# Patient Record
Sex: Male | Born: 1975 | Race: White | Hispanic: No | Marital: Married | Smoking: Never smoker
Health system: Southern US, Community
[De-identification: ages and names within clinical notes are randomized; demographics above are authoritative.]

## PROBLEM LIST (undated history)

## (undated) DIAGNOSIS — F909 Attention-deficit hyperactivity disorder, unspecified type: Secondary | ICD-10-CM

## (undated) DIAGNOSIS — F988 Other specified behavioral and emotional disorders with onset usually occurring in childhood and adolescence: Secondary | ICD-10-CM

## (undated) DIAGNOSIS — H18509 Unspecified hereditary corneal dystrophies, unspecified eye: Secondary | ICD-10-CM

## (undated) DIAGNOSIS — M545 Low back pain, unspecified: Secondary | ICD-10-CM

## (undated) DIAGNOSIS — J45909 Unspecified asthma, uncomplicated: Secondary | ICD-10-CM

## (undated) DIAGNOSIS — G4730 Sleep apnea, unspecified: Secondary | ICD-10-CM

## (undated) DIAGNOSIS — J309 Allergic rhinitis, unspecified: Secondary | ICD-10-CM

## (undated) DIAGNOSIS — G4733 Obstructive sleep apnea (adult) (pediatric): Principal | ICD-10-CM

## (undated) DIAGNOSIS — G4731 Primary central sleep apnea: Secondary | ICD-10-CM

## (undated) HISTORY — DX: Other specified behavioral and emotional disorders with onset usually occurring in childhood and adolescence: F98.8

## (undated) HISTORY — PX: WISDOM TOOTH EXTRACTION: SHX21

## (undated) HISTORY — DX: Primary central sleep apnea: G47.31

## (undated) HISTORY — DX: Unspecified hereditary corneal dystrophies, unspecified eye: H18.509

## (undated) HISTORY — DX: Obstructive sleep apnea (adult) (pediatric): G47.33

## (undated) HISTORY — DX: Low back pain, unspecified: M54.50

## (undated) HISTORY — DX: Allergic rhinitis, unspecified: J30.9

## (undated) HISTORY — DX: Morbid (severe) obesity due to excess calories: E66.01

---

## 2004-05-26 ENCOUNTER — Encounter: Admission: RE | Admit: 2004-05-26 | Discharge: 2004-08-24 | Payer: Self-pay | Admitting: Emergency Medicine

## 2006-05-18 HISTORY — PX: TONGUE BASE REDUCTION SOMNOPLASTY: SHX2535

## 2011-07-01 ENCOUNTER — Emergency Department (HOSPITAL_COMMUNITY): Payer: 59

## 2011-07-01 ENCOUNTER — Emergency Department (HOSPITAL_COMMUNITY)
Admission: EM | Admit: 2011-07-01 | Discharge: 2011-07-01 | Disposition: A | Discharge status: Home / Self Care | Payer: 59 | Attending: Emergency Medicine | Admitting: Emergency Medicine

## 2011-07-01 ENCOUNTER — Encounter (HOSPITAL_COMMUNITY): Payer: Self-pay

## 2011-07-01 DIAGNOSIS — R112 Nausea with vomiting, unspecified: Secondary | ICD-10-CM | POA: Insufficient documentation

## 2011-07-01 DIAGNOSIS — R1013 Epigastric pain: Secondary | ICD-10-CM

## 2011-07-01 DIAGNOSIS — R6883 Chills (without fever): Secondary | ICD-10-CM | POA: Insufficient documentation

## 2011-07-01 DIAGNOSIS — R10819 Abdominal tenderness, unspecified site: Secondary | ICD-10-CM | POA: Insufficient documentation

## 2011-07-01 DIAGNOSIS — K7689 Other specified diseases of liver: Secondary | ICD-10-CM | POA: Insufficient documentation

## 2011-07-01 HISTORY — DX: Attention-deficit hyperactivity disorder, unspecified type: F90.9

## 2011-07-01 HISTORY — DX: Unspecified asthma, uncomplicated: J45.909

## 2011-07-01 HISTORY — DX: Sleep apnea, unspecified: G47.30

## 2011-07-01 LAB — CBC
Hemoglobin: 15.7 g/dL (ref 13.0–17.0)
MCH: 27.6 pg (ref 26.0–34.0)
MCHC: 34.4 g/dL (ref 30.0–36.0)
Platelets: 187 10*3/uL (ref 150–400)
RDW: 14.5 % (ref 11.5–15.5)

## 2011-07-01 LAB — DIFFERENTIAL
Basophils Absolute: 0 10*3/uL (ref 0.0–0.1)
Basophils Relative: 0 % (ref 0–1)
Monocytes Relative: 11 % (ref 3–12)
Neutro Abs: 4.6 10*3/uL (ref 1.7–7.7)
Neutrophils Relative %: 69 % (ref 43–77)

## 2011-07-01 LAB — COMPREHENSIVE METABOLIC PANEL
AST: 17 U/L (ref 0–37)
CO2: 26 mEq/L (ref 19–32)
Calcium: 9 mg/dL (ref 8.4–10.5)
Creatinine, Ser: 1.07 mg/dL (ref 0.50–1.35)
GFR calc Af Amer: >90 mL/min (ref >90)
GFR calc non Af Amer: 87 mL/min — ABNORMAL LOW (ref >90)

## 2011-07-01 LAB — URINALYSIS, ROUTINE W REFLEX MICROSCOPIC
Glucose, UA: NEGATIVE mg/dL
Leukocytes, UA: NEGATIVE
Protein, ur: NEGATIVE mg/dL

## 2011-07-01 MED ORDER — ONDANSETRON HCL 4 MG/2ML IJ SOLN
4.0000 mg | Freq: Once | INTRAMUSCULAR | Status: AC
Start: 2011-07-01 — End: 2011-07-01
  Administered 2011-07-01: 4 mg via INTRAVENOUS
  Filled 2011-07-01: qty 2

## 2011-07-01 MED ORDER — MORPHINE SULFATE 4 MG/ML IJ SOLN
4.0000 mg | Freq: Once | INTRAMUSCULAR | Status: AC
  Administered 2011-07-01: 4 mg via INTRAVENOUS
  Filled 2011-07-01: qty 1

## 2011-07-01 MED ORDER — ACETAMINOPHEN 325 MG PO TABS
650.0000 mg | ORAL_TABLET | Freq: Once | ORAL | Status: AC
Start: 2011-07-01 — End: 2011-07-01
  Administered 2011-07-01: 650 mg via ORAL
  Filled 2011-07-01: qty 2

## 2011-07-01 MED ORDER — SODIUM CHLORIDE 0.9 % IV BOLUS (SEPSIS)
1000.0000 mL | Freq: Once | INTRAVENOUS | Status: AC
  Administered 2011-07-01: 1000 mL via INTRAVENOUS

## 2011-07-01 MED ORDER — ONDANSETRON HCL 4 MG PO TABS: 4.0000 mg | ORAL_TABLET | Freq: Four times a day (QID) | ORAL | Status: AC | PRN

## 2011-07-01 NOTE — ED Provider Notes (Signed)
History     CSN: [id number]  Arrival date & time 07/01/11  1311   First MD Initiated Contact with Patient 07/01/11 1357      Chief Complaint  Patient presents with  . Abdominal Pain    (Consider location/radiation/quality/duration/timing/severity/associated sxs/prior treatment) Patient is a 36 y.o. male presenting with abdominal pain. The history is provided by the patient.  Abdominal Pain The primary symptoms of the illness include abdominal pain, nausea and vomiting. The primary symptoms of the illness do not include shortness of breath, diarrhea, hematemesis or dysuria. Fever: tmax 100.4 at PCP office today. Episode onset: 2am yesterday morning. The onset of the illness was sudden. The problem has not changed since onset. The abdominal pain is located in the epigastric region. The abdominal pain radiates to the RUQ and back. Pain scale: moderate severity. The abdominal pain is relieved by nothing. Exacerbated by: nothing.  The vomiting began yesterday. Vomiting occurs 2 to 5 times per day. The emesis contains stomach contents.  Additional symptoms associated with the illness include chills and heartburn. Symptoms associated with the illness do not include constipation, hematuria or back pain. Associated symptoms comments: Excessive belching.  Pt seen by PMD earlier today for same complaint and sent to ED for further evaluation. Hx of 2 prior similar episodes that resolved spontaneously after several days. No recent alcohol intake.  Past Medical History  Diagnosis Date  . Sleep apnea   . Gout     History reviewed. No pertinent past surgical history.  No family history on file.  History  Substance Use Topics  . Smoking status: Never Smoker   . Smokeless tobacco: Not on file  . Alcohol Use:       Review of Systems  Constitutional: Positive for chills. Fever: tmax 100.4 at PCP office today.  HENT: Negative for nosebleeds, sore throat, neck pain and neck stiffness.   Eyes:  Negative for pain and visual disturbance.  Respiratory: Negative for cough, chest tightness and shortness of breath.   Cardiovascular: Negative for chest pain and palpitations.  Gastrointestinal: Positive for heartburn, nausea, vomiting and abdominal pain. Negative for diarrhea, constipation and hematemesis.  Genitourinary: Negative for dysuria, hematuria and flank pain.  Musculoskeletal: Negative for back pain and gait problem.  Skin: Negative for rash.  Neurological: Negative for syncope, weakness, numbness and headaches.  Hematological: Does not bruise/bleed easily.  Psychiatric/Behavioral: Negative for confusion.    Allergies  Review of patient's allergies indicates no known allergies.  Home Medications   Current Outpatient Rx  Name Route Sig Dispense Refill  . IBUPROFEN 200 MG PO TABS Oral Take 400 mg by mouth every 6 (six) hours as needed. pain    . LISDEXAMFETAMINE DIMESYLATE 60 MG PO CAPS Oral Take 60 mg by mouth every morning.    Marland Kitchen LORATADINE 10 MG PO CAPS Oral Take 10 mg by mouth daily.    . MOMETASONE FUROATE 50 MCG/ACT NA SUSP Nasal Place 2 sprays into the nose daily.      BP 121/73  Pulse 110  Temp 99.2 F (37.3 C)  Resp 18  Wt 305 lb (138.347 kg)  SpO2 99%  Physical Exam  Nursing note and vitals reviewed. Constitutional: He is oriented to person, place, and time. He appears well-developed and well-nourished. No distress.  HENT:  Head: Normocephalic and atraumatic.  Right Ear: External ear normal.  Left Ear: External ear normal.  Mouth/Throat: Oropharynx is clear and moist.  Eyes: Conjunctivae are normal. Pupils are equal, round, and reactive  to light.  Neck: Normal range of motion. Neck supple.  Cardiovascular: Regular rhythm, normal heart sounds and intact distal pulses.   No murmur heard.      HR 104 on exam  Pulmonary/Chest: Effort normal and breath sounds normal. No respiratory distress. He has no wheezes. He exhibits no tenderness.  Abdominal: Soft.  Bowel sounds are normal. He exhibits no distension and no pulsatile midline mass. There is no hepatomegaly. There is tenderness in the right upper quadrant and epigastric area. There is no rigidity, no rebound and no guarding.       obese  Musculoskeletal: He exhibits no edema and no tenderness.  Neurological: He is alert and oriented to person, place, and time. No cranial nerve deficit. Coordination normal.  Skin: Skin is warm and dry. No rash noted.  Psychiatric: He has a normal mood and affect.    ED Course  Procedures (including critical care time)  Labs Reviewed  URINALYSIS, ROUTINE W REFLEX MICROSCOPIC - Abnormal; Notable for the following:    Ketones, ur TRACE (*)    All other components within normal limits  COMPREHENSIVE METABOLIC PANEL - Abnormal; Notable for the following:    Glucose, Bld 100 (*)    GFR calc non Af Amer 87 (*)    All other components within normal limits  CBC  LIPASE, BLOOD  DIFFERENTIAL  DIFFERENTIAL   Dg Chest 2 View  07/01/2011  *RADIOLOGY REPORT*  Clinical Data: Pain  CHEST - 2 VIEW  Comparison: None.  Findings: Heart size upper limits normal.  Lungs are clear.  No effusion.  Regional bones unremarkable.  IMPRESSION:  1.  No acute disease  Original Report Authenticated By: Osa Craver, M.D.   US Abdomen Complete  07/01/2011  *RADIOLOGY REPORT*  Clinical Data:  Right upper quadrant abdominal pain.  COMPLETE ABDOMINAL ULTRASOUND  Comparison:  None.  Findings:  Gallbladder:  No gallstones, gallbladder wall thickening, or pericholecystic fluid.  Common bile duct:  Normal in caliber measuring a maximum of 5.51mm.  Liver:  There is diffuse increased echogenicity of the liver and decreased through transmission consistent with fatty infiltration. No focal lesions or biliary dilatation.  IVC:  Not visualized due to body habitus.  Pancreas:  Limited visualization due to body habitus and overlying bowel gas.  Spleen:  Normal size and echogenicity without  focal lesions.  Right Kidney:  12.3 cm in length. Normal renal cortical thickness and echogenicity without focal lesions or hydronephrosis.  Left Kidney:  11.9 cm in length. Normal renal cortical thickness and echogenicity without focal lesions or hydronephrosis.  Abdominal aorta:  Normal caliber.  IMPRESSION:  1.  Normal sonographic appearance of the gallbladder and normal caliber common bile duct. 2.  Diffuse fatty infiltration of the liver. 3.  Poor visualization of the IVC, pancreas and aorta.  Original Report Authenticated By: P. Loralie Champagne, M.D.     1. Epigastric pain   2. Nausea and vomiting       MDM  2:00 PM Epigastric/RUQ pain x 2 days. Low-grade fever at PCP office. Labs, pain/nausea medication, fluid bolus ordered. Will re-eval.    4:05 PM Labs unremarkable (no leukocytosis, no elevated lipase, no transaminitis, no anemia). Continued pain- will order ABD [place] to eval for biliary pathology.    7:10 PM [place] reviewed, significant only for fatty liver. Pt with fever on most recent VS check, will order CXR to eval for occult pneumonia causing epigastric pain with fever.     8:13 PM  CXR without evidence of pneumonia. Pt in no distress. Abd benign on re-exam. Will d/c home. Pt has an rx for prilosec from his PCP that he has not started taking- advised that he begin taking this. WIll give rx for nausea medication. Declines pain medication rx, advised to take tylenol. Return precautions discussed.        Shaaron Adler, [place] 07/01/11 2017

## 2011-07-01 NOTE — Discharge Instructions (Signed)
Your labs showed no abnormalities to suggest a serious cause for your abdominal pain. Your ultrasound showed no inflammation of your gallbladder or gallstones. Take the prilosec as previously directed by your doctor. Use the zofran as needed for nausea/vomting. Take tylenol for pain as needed. Follow-up with your doctor as discussed. Return to the ER if needed for any new or concerning symptoms.  Abdominal Pain Abdominal pain can be caused by many things. Your caregiver decides the seriousness of your pain by an examination and possibly blood tests and X-rays. Many cases can be observed and treated at home. Most abdominal pain is not caused by a disease and will probably improve without treatment. However, in many cases, more time must pass before a clear cause of the pain can be found. Before that point, it may not be known if you need more testing, or if hospitalization or surgery is needed. HOME CARE INSTRUCTIONS   Do not take laxatives unless directed by your caregiver.   Take pain medicine only as directed by your caregiver.   Only take over-the-counter or prescription medicines for pain, discomfort, or fever as directed by your caregiver.   Try a clear liquid diet (broth, tea, or water) for as long as directed by your caregiver. Slowly move to a bland diet as tolerated.  SEEK IMMEDIATE MEDICAL CARE IF:   The pain does not go away.   You have a persistent fever.   You keep throwing up (vomiting).   The pain is felt only in portions of the abdomen. Pain in the right side could possibly be appendicitis. In an adult, pain in the left lower portion of the abdomen could be colitis or diverticulitis.   You pass bloody or black tarry stools.  MAKE SURE YOU:   Understand these instructions.   Will watch your condition.   Will get help right away if you are not doing well or get worse.  Document Released: 02/11/2005 Document Revised: 01/14/2011 Document Reviewed: 12/21/2007 [facility] [facility]  Patient Information 2012 [place], [place].

## 2011-07-01 NOTE — ED Notes (Signed)
Clare Gandy, RN at bedside to attempt IV start due to original IV not infusing well. Pt informed of pending abd [place]. Verbalizes understanding and agreement.

## 2011-07-01 NOTE — ED Notes (Signed)
Patient reports that he was sent from [facility] for further evaluation of his 3rd episode of epigastric pain with fever, vomiting and chills, no diarrhea. No radiation

## 2011-07-01 NOTE — ED Provider Notes (Signed)
Medical screening examination/treatment/procedure(s) were performed by non-physician practitioner and as supervising physician I was immediately available for consultation/collaboration.  Ethelda Chick, MD 07/01/11 2037

## 2011-07-01 NOTE — ED Notes (Signed)
Family at bedside. 

## 2012-08-25 ENCOUNTER — Institutional Professional Consult (permissible substitution): Payer: Self-pay | Admitting: Neurology

## 2012-08-25 ENCOUNTER — Ambulatory Visit (INDEPENDENT_AMBULATORY_CARE_PROVIDER_SITE_OTHER): Payer: BC Managed Care – PPO | Admitting: Neurology

## 2012-08-25 ENCOUNTER — Encounter: Payer: Self-pay | Admitting: Neurology

## 2012-08-25 VITALS — BP 133/87 | HR 98 | Temp 98.7°F | Ht 70.0 in | Wt 304.0 lb

## 2012-08-25 DIAGNOSIS — G4733 Obstructive sleep apnea (adult) (pediatric): Secondary | ICD-10-CM | POA: Insufficient documentation

## 2012-08-25 DIAGNOSIS — J3489 Other specified disorders of nose and nasal sinuses: Secondary | ICD-10-CM

## 2012-08-25 DIAGNOSIS — F988 Other specified behavioral and emotional disorders with onset usually occurring in childhood and adolescence: Secondary | ICD-10-CM

## 2012-08-25 DIAGNOSIS — J309 Allergic rhinitis, unspecified: Secondary | ICD-10-CM

## 2012-08-25 DIAGNOSIS — G4730 Sleep apnea, unspecified: Secondary | ICD-10-CM

## 2012-08-25 DIAGNOSIS — R0981 Nasal congestion: Secondary | ICD-10-CM

## 2012-08-25 DIAGNOSIS — G4731 Primary central sleep apnea: Secondary | ICD-10-CM | POA: Insufficient documentation

## 2012-08-25 HISTORY — DX: Obstructive sleep apnea (adult) (pediatric): G47.33

## 2012-08-25 HISTORY — DX: Other specified behavioral and emotional disorders with onset usually occurring in childhood and adolescence: F98.8

## 2012-08-25 HISTORY — DX: Allergic rhinitis, unspecified: J30.9

## 2012-08-25 HISTORY — DX: Primary central sleep apnea: G47.31

## 2012-08-25 NOTE — Patient Instructions (Addendum)
We are going to do a sleep study with full night bipap and I will prescribe a new machine for you.

## 2012-08-25 NOTE — Progress Notes (Signed)
Subjective:    Patient ID: Christian Shaw is a 37 y.o. male.  HPI Christian Foley, MD, PhD [facility] [address], [address] [address] [place], [place] [address]  Dear Dr. Nicholos Johns,   I saw your patient, Christian Shaw, upon your kind request in my neurologic clinic today for initial consultation of his sleep disorder. The patient is accompanied By his wife, Christian Shaw, today. As you know, Mr. Chavira is a very pleasant 37 year old right-handed gentleman with an underlying medical history of gout, allergic rhinitis, ADD, and reflux who was diagnosed with complex sleep apnea by a sleep specialist at [facility] and was given a BiPAP machine. He denies any sleep paralysis, cataplexy, hypnagogic or hypnopompic hallucinations or sleep attacks. He Has had parasomnias. He denies any recurrent headaches. I saw note from [facility] from 07/24/2008 at which time he was presenting for followup of his severe obstructive sleep apnea with complex features. His BiPAP was high at 19/11 cm with a backup rate of 11. His backup rate was reduced at 8. He does have a history of sleep apnea surgery in February 2009 In the form of rhinoplasty. His first sleep study was in 08/2006 and I reviewed his sleep studies, that he brought for me For review. He also brought his most recent compliance data for review which are appreciated greatly. His baseline sleep study from April 2008 showed severe obstructive events. He had 252 obstructive apneas. His AHI was 108.55 per hour. He had a total of 27 central apneas and 6 central hypotony is. His lowest oxygen saturation was 73%. He was diagnosed with severe complex sleep apnea. He was then brought back a month later for CPAP titration and was titrated to a pressure of 15 cm with oxygen at 2 L per minute. He had trouble tolerating CPAP and eventually had a BiPAP titration. Originally he was placed on BiPAP of 19/11 with a backup rate of 11 and then changed to a backup rate of 8. I  reviewed his compliance data from 01/28/2012 to 07/25/2012 which is a total of 180 days, during which time he used BiPAP every night with an average usage of 6 hours and 3 minutes, and percent used days greater than 4 hours of 94.4%. I congratulated him on his excellent compliance.His main complaints with using BiPAP at this juncture or the high pressure, intermittent severe nasal congestion, and he is wondering if he could have a more updated machine. This machine is from 2008 from what I understand. He should be eligible for a new machine. His  humidifier setting cannot be changed. Also he has been trying to lose weight. He has lost about 30 pounds in the last 12 months. He has been overweight for quite some time. He has been on a stimulant for his ADD for over 12 months and that has also helped with weight loss. He uses a Regulatory affairs officer [employer].   His Past Medical History Is Significant For: Past Medical History  Diagnosis Date  . Sleep apnea   . Gout   . Childhood asthma   . ADHD (attention deficit hyperactivity disorder)   . CSA (central sleep apnea) 08/25/2012  . OSA (obstructive sleep apnea) 08/25/2012  . ADD (attention deficit disorder) 08/25/2012  . Allergic rhinitis 08/25/2012    His Past Surgical History Is Significant For: Past Surgical History  Procedure Laterality Date  . Wisdom tooth extraction      His Family History Is Significant For: No family history on  file.  His Social History Is Significant For: History   Social History  . Marital Status: Unknown    Spouse Name: N/A    Number of Children: N/A  . Years of Education: N/A   Social History Main Topics  . Smoking status: Never Smoker   . Smokeless tobacco: None  . Alcohol Use: Yes  . Drug Use: No  . Sexually Active:    Other Topics Concern  . None   Social History Narrative  . None    His Allergies Are:  No Known Allergies:   His Current Medications Are:  Outpatient Encounter  Prescriptions as of 08/25/2012  Medication Sig Dispense Refill  . ibuprofen (ADVIL,MOTRIN) 200 MG tablet Take 400 mg by mouth every 6 (six) hours as needed. pain      . lisdexamfetamine (VYVANSE) 60 MG capsule Take 60 mg by mouth every morning.      . Loratadine (CLARITIN) 10 MG CAPS Take 10 mg by mouth daily.      . mometasone (NASONEX) 50 MCG/ACT nasal spray Place 2 sprays into the nose daily.       No facility-administered encounter medications on file as of 08/25/2012.  :   Review of Systems  Allergic/Immunologic: Positive for environmental allergies.  Psychiatric/Behavioral: Positive for sleep disturbance.       Insomnia, sleepiness    Objective:  Neurologic Exam  Physical Exam Physical Examination:   Filed Vitals:   08/25/12 0853  BP: 133/87  Pulse: 98  Temp: 98.7 F (37.1 C)    General Examination: The patient is a very pleasant 37 y.o. male in no acute distress.  HEENT: Normocephalic, atraumatic, pupils are equal, round and reactive to light and accommodation. Funduscopic exam is normal with sharp disc margins noted. Extraocular tracking is good without nystagmus noted. Normal smooth pursuit is noted. Hearing is grossly intact. Tympanic membranes are clear bilaterally. Face is symmetric with normal facial animation and normal facial sensation. Speech is clear with no dysarthria noted. There is no hypophonia. There is no lip, neck or jaw tremor. Neck is supple with full range of motion. There are no carotid bruits on auscultation. Oropharynx exam reveals normal findings. Moderate airway crowding is noted. Mallampati is class III. Neck size is 19 1/2 inches. Tongue protrudes centrally and palate elevates symmetrically. Tonsils are 1+.   Chest: is clear to auscultation without wheezing, rhonchi or crackles noted.  Heart: sounds are regular and normal without murmurs, rubs or gallops noted.   Abdomen: is soft, non-tender and non-distended with normal bowel sounds appreciated  on auscultation.  Extremities: There is no pitting edema in the distal lower extremities bilaterally.   Skin: is warm and dry with no trophic changes noted.  Musculoskeletal: exam reveals no obvious joint deformities, tenderness or joint swelling or erythema.  Neurologically:  Mental status: The patient is awake, alert and oriented in all 4 spheres. His memory, attention, language and knowledge are appropriate. There is no aphasia, agnosia, apraxia or anomia. Speech is clear with normal prosody and enunciation. Thought process is linear. Mood is congruent and affect is normal.  Cranial nerves are as described above under HEENT exam. In addition, shoulder shrug is normal with equal shoulder height noted. Motor exam: Normal bulk, strength and tone is noted. There is no drift, tremor or rebound. Romberg is negative. Reflexes are 2+ throughout. Toes are downgoing bilaterally. Fine motor skills are intact with normal finger taps, normal hand movements, normal rapid alternating patting, normal foot taps and normal  foot agility.  Cerebellar testing shows no dysmetria or intention tremor on finger to nose testing. There is no truncal or gait ataxia.  Sensory exam is intact to light touch, pinprick, vibration, temperature sense.  Gait, station and balance are unremarkable. No veering to one side is noted. No leaning to one side. Posture is age-appropriate and stance is narrow based. No problems turning are noted. Tandem walk is unremarkable.   Assessment and Plan:   Assessment and Plan:  In summary, Christian Shaw is a very pleasant 37 y.o.-year old male with a history of Severe obstructive sleep apnea with evidence of central sleep apnea as well. He has done reasonably well on BiPAP therapy, But reports ongoing issues with nasal congestion and has been able to lose weight in the past year. He has approximately lost 30 pounds in the last 12 months and is approximately losing 1-2 pounds a month at this  moment. His machine that he brought and is over 10 years old and he is eligible for a new machine but I would like to suggest reevaluation of his pressure and settings as well as mask fitting, since his weight loss and his complaints of nasal congestion. To that end, I would like for him to come back for a full night BiPAP titration study and we will start perhaps a pressure of 14/10 without backup rate and titrate as tolerated and as needed and add a backup rate if needed. Alternatively, he may need ASV which I explained to him. He was agreeable to coming back and is looking forward to using a more up-to-date machine. His humidifier does not allow him to change the setting except for heated versus nonheated. His machine is also rather bulky. He is advised that I would like to see him back after the sleep studies completed and he was in agreement. Since his stimulant for ADD does not prevent him from falling asleep at night I did not ask him to taper it off and just reminded him to take an extra early that morning. He and his wife were in agreement and I answered all their questions today. I asked him to call with any interim questions, concerns, problems or updates.   Thank you very much for allowing me to participate in the care of this nice patient. If I can be of any further assistance to you please do not hesitate to call me at [phone].  Sincerely,   Christian Foley, MD, PhD

## 2012-08-30 ENCOUNTER — Ambulatory Visit (INDEPENDENT_AMBULATORY_CARE_PROVIDER_SITE_OTHER): Payer: BC Managed Care – PPO

## 2012-08-30 DIAGNOSIS — R0989 Other specified symptoms and signs involving the circulatory and respiratory systems: Secondary | ICD-10-CM

## 2012-08-30 DIAGNOSIS — Z0289 Encounter for other administrative examinations: Secondary | ICD-10-CM

## 2012-08-30 DIAGNOSIS — R0609 Other forms of dyspnea: Secondary | ICD-10-CM

## 2012-08-30 DIAGNOSIS — G4733 Obstructive sleep apnea (adult) (pediatric): Secondary | ICD-10-CM

## 2012-08-30 DIAGNOSIS — G4710 Hypersomnia, unspecified: Secondary | ICD-10-CM

## 2012-09-02 ENCOUNTER — Other Ambulatory Visit: Payer: Self-pay | Admitting: Neurology

## 2012-09-02 DIAGNOSIS — G4731 Primary central sleep apnea: Secondary | ICD-10-CM

## 2012-09-02 DIAGNOSIS — G4733 Obstructive sleep apnea (adult) (pediatric): Secondary | ICD-10-CM

## 2013-01-27 ENCOUNTER — Encounter: Payer: Self-pay | Admitting: Neurology

## 2013-01-27 ENCOUNTER — Ambulatory Visit (INDEPENDENT_AMBULATORY_CARE_PROVIDER_SITE_OTHER): Payer: BC Managed Care – PPO | Admitting: Neurology

## 2013-01-27 VITALS — BP 118/67 | HR 87 | Temp 97.7°F | Ht 71.0 in | Wt 307.0 lb

## 2013-01-27 DIAGNOSIS — J3489 Other specified disorders of nose and nasal sinuses: Secondary | ICD-10-CM

## 2013-01-27 DIAGNOSIS — R0981 Nasal congestion: Secondary | ICD-10-CM

## 2013-01-27 DIAGNOSIS — G4731 Primary central sleep apnea: Secondary | ICD-10-CM

## 2013-01-27 DIAGNOSIS — G4733 Obstructive sleep apnea (adult) (pediatric): Secondary | ICD-10-CM

## 2013-01-27 DIAGNOSIS — J309 Allergic rhinitis, unspecified: Secondary | ICD-10-CM

## 2013-01-27 DIAGNOSIS — F988 Other specified behavioral and emotional disorders with onset usually occurring in childhood and adolescence: Secondary | ICD-10-CM

## 2013-01-27 DIAGNOSIS — G4730 Sleep apnea, unspecified: Secondary | ICD-10-CM

## 2013-01-27 NOTE — Progress Notes (Signed)
Subjective:    Patient ID: Christian Shaw is a 37 y.o. male.  HPI  Interim history:   Christian Shaw is a very pleasant 37 year old right-handed gentleman with an underlying medical history of gout, allergic rhinitis, ADD, and reflux who presents for followup consultation after a recent split-night sleep study. He is unaccompanied today. I first met him on 08/25/2012 at which time he reported a history of complex sleep apnea, which was diagnosed by a sleep specialist at [facility] years ago and he was given a BiPAP machine. I had reviewed a note from [facility] from 07/24/2008 at which time he was presenting for followup of his severe obstructive sleep apnea with complex features. His BiPAP was high at 19/11 cm with a backup rate of 11. His backup rate was reduced to 8. He has a history of sleep apnea surgery in February 2009 in the form of rhinoplasty.  His first sleep study was in 08/2006 and I reviewed his sleep studies last time, that he brought for me for review. He also brought his most recent compliance data for review which are appreciated greatly. His baseline sleep study from April 2008 showed severe obstructive events. He had 252 obstructive apneas. His AHI was 108.55 per hour. He had a total of 27 central apneas and 6 central hypotony is. His lowest oxygen saturation was 73%. He was diagnosed with severe complex sleep apnea. He was then brought back a month later for CPAP titration and was titrated to a pressure of 15 cm with oxygen at 2 L per minute. He had trouble tolerating CPAP and eventually had a BiPAP titration. Originally he was placed on BiPAP of 19/11 with a backup rate of 11 and then changed to a backup rate of 8.  I reviewed his compliance data from 01/28/2012 to 07/25/2012 which is a total of 180 days, during which time he used BiPAP every night with an average usage of 6 hours and 3 minutes, and percent used days greater than 4 hours of 94.4%. I congratulated him on his excellent compliance.  His main complaint was intermittent severe nasal congestion, and he was hoping to get a more updated machine. His old machine was from 2008 from what I understand. He has been trying to lose weight. He had lost about 30 pounds. He has been overweight for quite some time. He has been on a stimulant for his ADD for over 12 months and that has also helped with weight loss. I suggested that he come back for a full night BiPAP titration study and he had a split-night study on 08/30/2012 which I reviewed with him. Sleep efficiency was reduced severely at 32.6% with a latency to sleep of only 7.5 minutes but wake after sleep onset of 117.5 minutes with moderate sleep fragmentation noted. He had a markedly elevated arousal index of 94.4 per hour and a markedly increased percentage of stage I sleep at 81.1% and very low percentage of stage II sleep and absence of slow-wave and REM sleep. He had a total of 17 obstructive and 33 central apneas as well as 27 obstructive and 2 central hypopneas rendering and elevated AHI of 85.6 per hour. His baseline oxygen saturation was 92% with a nadir of 75%. He was then titrated on BiPAP starting at 14/10 cm and titrating up to 16/12 cm. He had an AHI of 9.1 on that pressure. REM sleep was achieved but no supine REM sleep. His post PAP oxygen saturation was 93% on average with a nadir  of 87%. I also reviewed his most recent compliance data from 08/29/2012 through 01/26/2013 which is a total of 151 days during which time he used it every day. His percent used days greater than 4 hours was 88.7%. Average usage for all days was 5037 minutes. His compliance data from 10/17/2012 through 01/26/2013 which is a total of 102 days showed daily usage as well with an average usage of 5 hours and 36 minutes and percent used days greater than 4 hours of 89.2% indicating very good compliance. Unfortunately, I do not see a listing for residual AHI. However, he has had a trouble adjusting to the new  settings. At times, he wakes up in a panic, as if he is not breathing. When I compare his resdual apneas average from 6/14 on the old setting to early 9/14, on the new setting, it looks like his average AHI has come down by 50%. His current mask is a [employer] FFM, which he likes better. But he still has intermittent severe nasal congestion, for which he takes prn Claritin, nasonex nightly. He has not been doing nasal rinses. He has gained a little weight, but is back on track. He has had some stressors written regards to his wife's health. This has improved lately.    His Past Medical History Is Significant For: Past Medical History  Diagnosis Date  . Sleep apnea   . Gout   . Childhood asthma   . ADHD (attention deficit hyperactivity disorder)   . CSA (central sleep apnea) 08/25/2012  . OSA (obstructive sleep apnea) 08/25/2012  . ADD (attention deficit disorder) 08/25/2012  . Allergic rhinitis 08/25/2012    His Past Surgical History Is Significant For: Past Surgical History  Procedure Laterality Date  . Wisdom tooth extraction      His Family History Is Significant For: No family history on file.  His Social History Is Significant For: History   Social History  . Marital Status: Unknown    Spouse Name: N/A    Number of Children: N/A  . Years of Education: N/A   Social History Main Topics  . Smoking status: Never Smoker   . Smokeless tobacco: None  . Alcohol Use: Yes  . Drug Use: No  . Sexual Activity:    Other Topics Concern  . None   Social History Narrative  . None    His Allergies Are:  No Known Allergies:   His Current Medications Are:  Outpatient Encounter Prescriptions as of 01/27/2013  Medication Sig Dispense Refill  . colchicine 0.6 MG tablet Take 0.6 mg by mouth as needed.      Marland Kitchen ibuprofen (ADVIL,MOTRIN) 200 MG tablet Take 400 mg by mouth every 6 (six) hours as needed. pain      . lisdexamfetamine (VYVANSE) 60 MG capsule Take 60 mg by mouth every  morning.      . Loratadine (CLARITIN) 10 MG CAPS Take 10 mg by mouth daily.      . mometasone (NASONEX) 50 MCG/ACT nasal spray Place 2 sprays into the nose daily.       No facility-administered encounter medications on file as of 01/27/2013.  : Review of Systems  Constitutional:       Weight gain  Musculoskeletal: Positive for joint swelling.       Joint pain, gout  Psychiatric/Behavioral:       Not enough sleep sleepiness    Objective:  Neurologic Exam  Physical Exam Physical Examination:   Filed Vitals:   01/27/13  1029  BP: 118/67  Pulse: 87  Temp: 97.7 F (36.5 C)   General Examination: The patient is a very pleasant 37 y.o. male in no acute distress. He appears well-developed and well-nourished and well groomed. He is significantly obese.  HEENT: Normocephalic, atraumatic, pupils are equal, round and reactive to light and accommodation. Extraocular tracking is good without nystagmus noted. Normal smooth pursuit is noted. Hearing is grossly intact. Face is symmetric with normal facial animation and normal facial sensation. Speech is clear with no dysarthria noted. There is no hypophonia. There is no lip, neck or jaw tremor. Neck is supple with full range of motion. There are no carotid bruits on auscultation. Oropharynx exam reveals normal findings. Moderate airway crowding is noted. Mallampati is class III. Tongue protrudes centrally and palate elevates symmetrically. Tonsils are 1+. Nasal inspection shows no mucosal bogginess but significant redness of his mucosal membranes.  Chest: is clear to auscultation without wheezing, rhonchi or crackles noted.  Heart: sounds are regular and normal without murmurs, rubs or gallops noted.   Abdomen: is soft, non-tender and non-distended with normal bowel sounds appreciated on auscultation.  Extremities: There is no pitting edema in the distal lower extremities bilaterally.   Skin: is warm and dry with no trophic changes  noted.  Musculoskeletal: exam reveals no obvious joint deformities, tenderness or joint swelling or erythema.  Neurologically:  Mental status: The patient is awake, alert and oriented in all 4 spheres. His memory, attention, language and knowledge are appropriate. There is no aphasia, agnosia, apraxia or anomia. Speech is clear with normal prosody and enunciation. Thought process is linear. Mood is congruent and affect is normal.  Cranial nerves are as described above under HEENT exam. In addition, shoulders show equal shoulder height noted. Motor exam: Normal bulk, strength and tone is noted. There is no drift, tremor or rebound. Fine motor skills are intact.  There is no dysmetria or intention tremor. There is no truncal or gait ataxia.  Sensory exam is intact to light touch.  Gait, station and balance are unremarkable. No veering to one side is noted. No leaning to one side. Posture is age-appropriate and stance is narrow based. No problems turning are noted.   Most of our 40 minute visit, more than 50% of the visit, was spent in counseling and coordination of care.  Assessment and Plan:   In summary, Erasmus Bistline is a very pleasant 37 y.o.-year old male with a history of severe obstructive sleep apnea with evidence of central sleep apneas as well. He has undergone a recent split-night study and just recently had a change in his settings from originally 19/11 cm with a backup rate of 8 per minute to the new settings of 16/12 cm with a backup rate of 8. His diagnostic portion confirmed severe obstructive and severe central sleep apnea syndrome. While I did not prescribe BiPAP ST he still uses his old machine which is a BiPAP ST mode. He has had more awakenings and no panic, feeling that he is not breathing. I reviewed his test results in detail with him and indicated that he is not necessarily optimally treated at this time but the settings of 16/12 where the past we achieved during the  recent split-night study. At this juncture, I suggested that we change his settings a little bit to 17/12 and keep his backup rate a little higher at 10 per minute. I think it is worth trying. I would like to reevaluate him with compliance data  in about 3 months, sooner if the need arises. At some point we may have to consider bringing him back for a BiPAP ST we titration or ASV titration. He is congratulated on being very compliant with the use of BiPAP and is also commended on trying to lose weight. Furthermore, I have asked him to use the humidifier of his machine because the pressure is quite high and his nasal passages to look irritated. In addition, he is advised to try using a nasal rinse system such as neti pot. He was in agreement with the plan. I asked him to call with any interim questions, concerns, problems or updates.

## 2013-01-27 NOTE — Patient Instructions (Signed)
We are going to see how your new settings go: 17/12 cm with a back up rate of 10/minute. Try using the humidifier with distilled water and try using a neti pot or the likes.  Follow up in 3 months, we will discuss the possibility for a retitration.

## 2013-03-26 IMAGING — US US ABDOMEN COMPLETE
1 series · 14 of 25 positions shown · non-contrast
Comparison: None.

CLINICAL DATA: Right upper quadrant abdominal pain.

COMPLETE ABDOMINAL ULTRASOUND

[Series 1: us abdomen complete · 0.49mm/px · 14 of 76 slices shown]
[im 1/76]
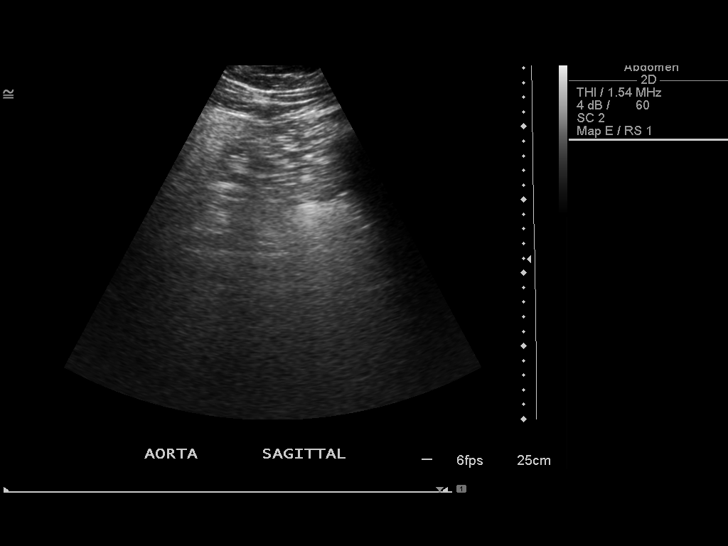
[im 7/76]
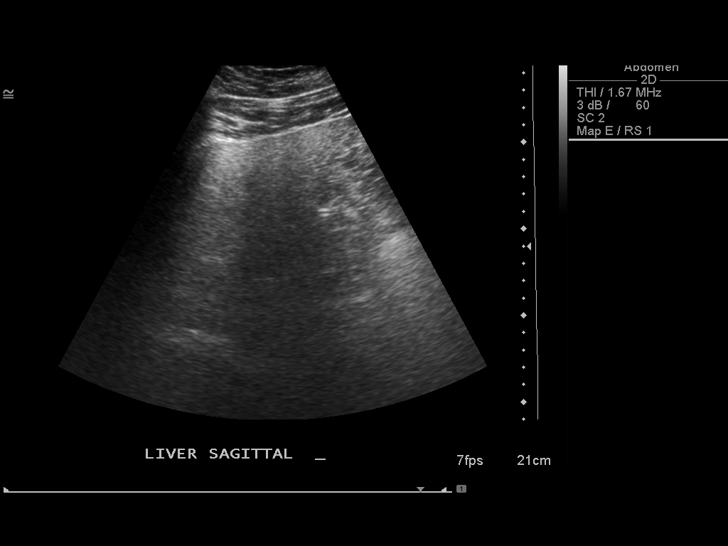
[im 13/76]
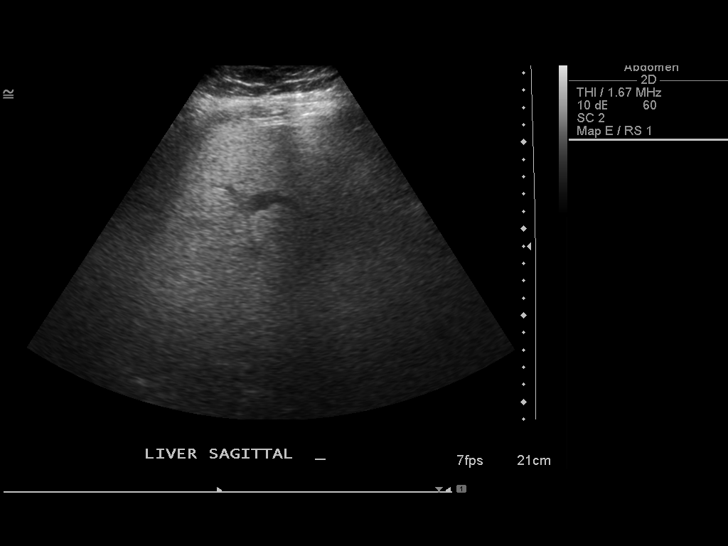
[im 19/76]
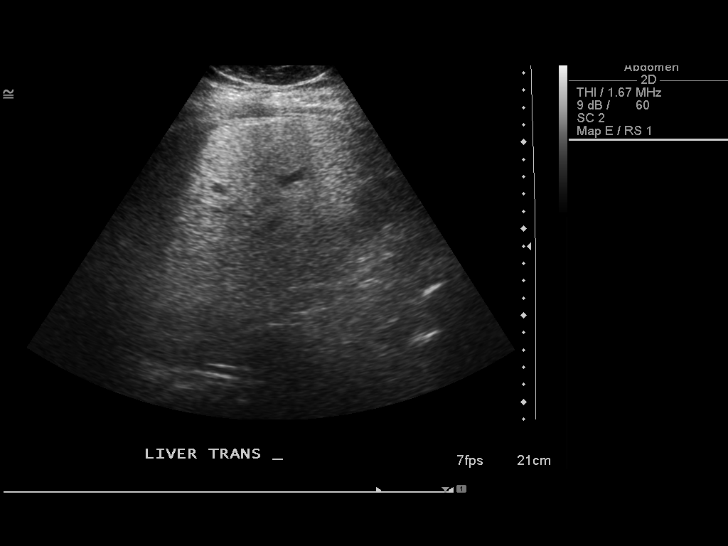
[im 26/76]
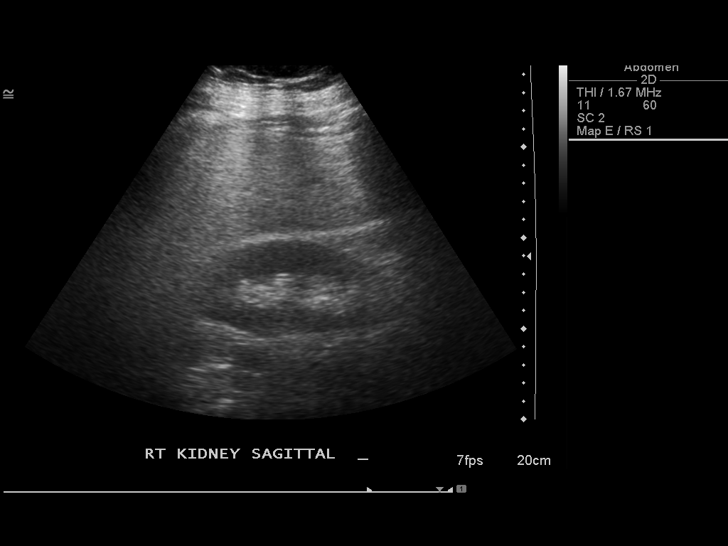
[im 29/76]
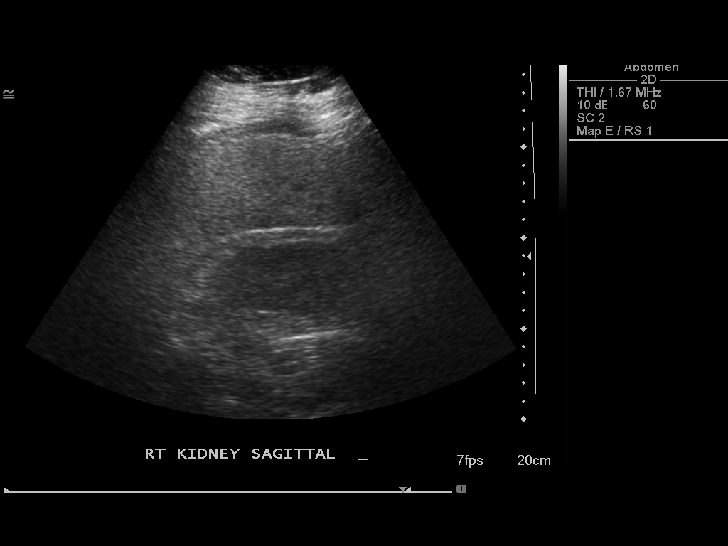
[im 35/76]
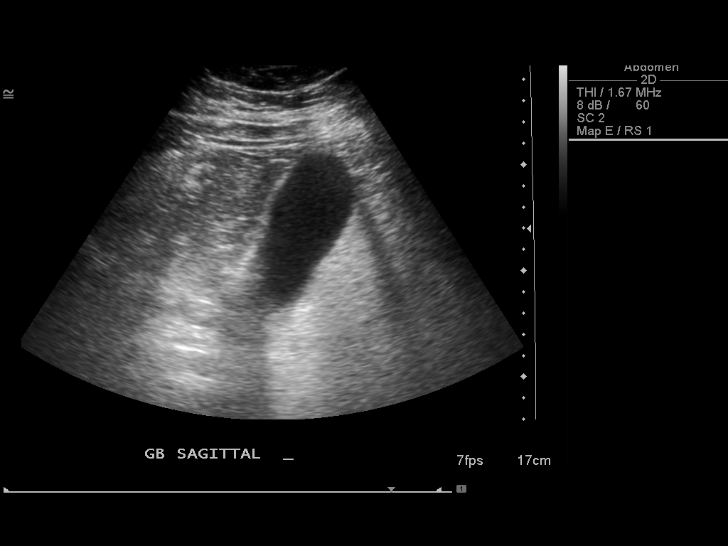
[im 41/76]
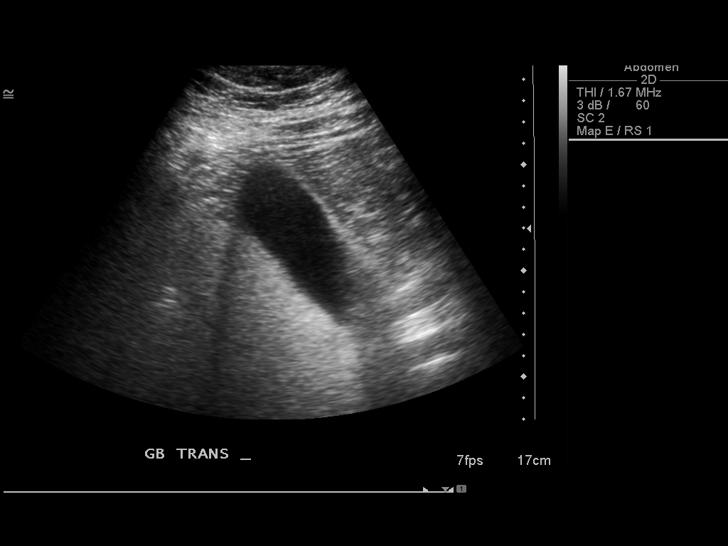
[im 47/76]
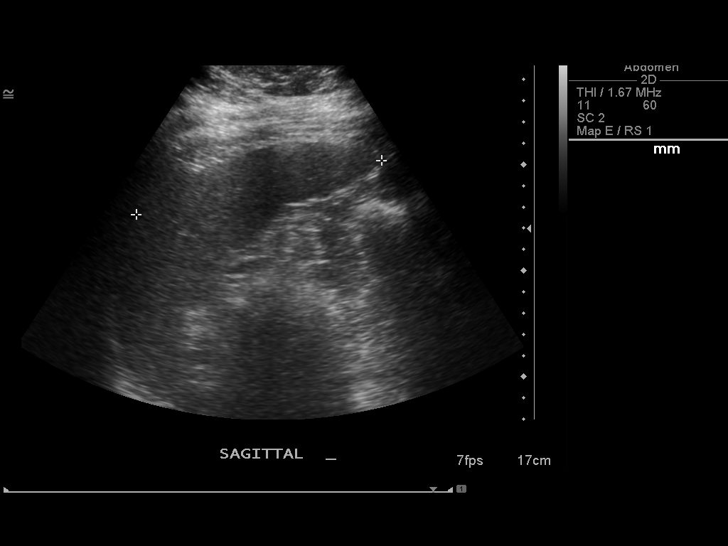
[im 51/76]
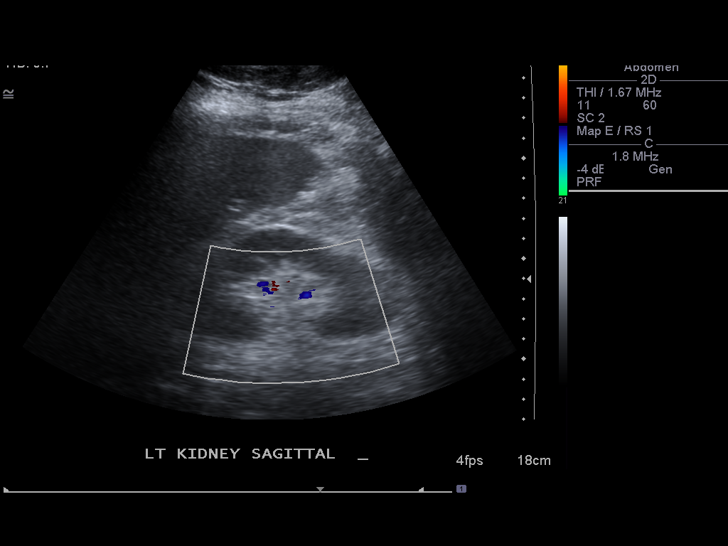
[im 57/76]
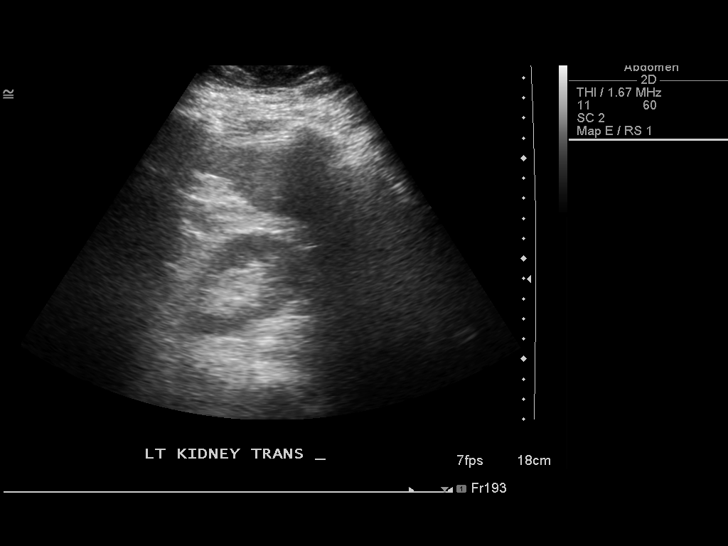
[im 63/76]
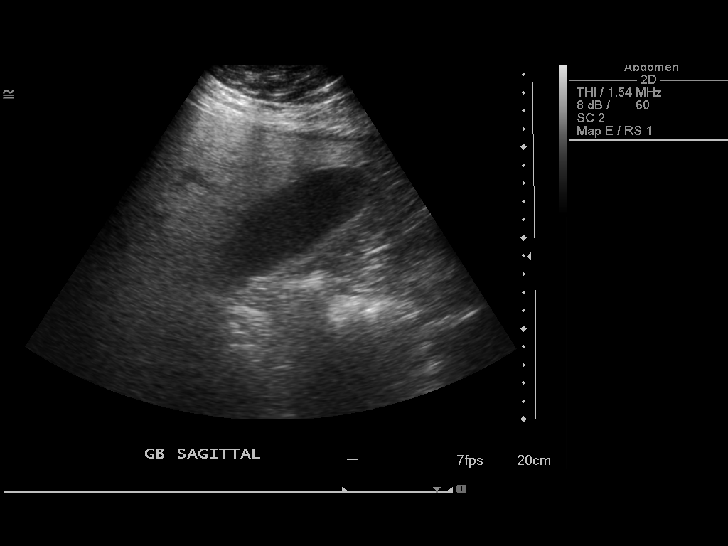
[im 69/76]
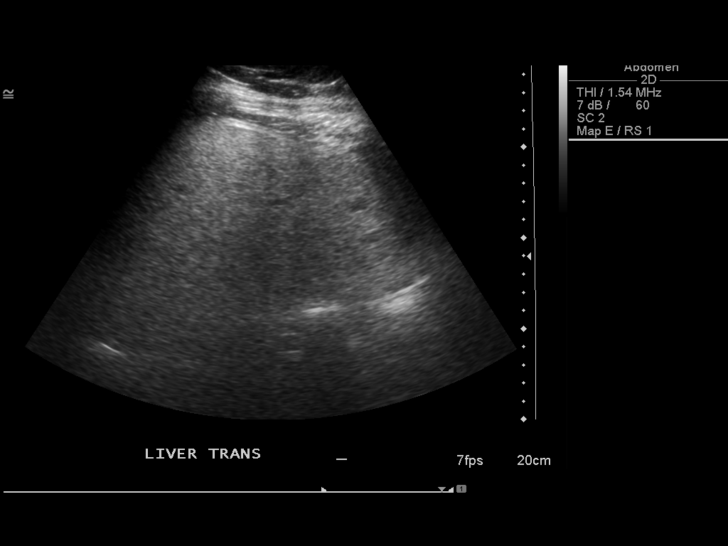
[im 76/76]
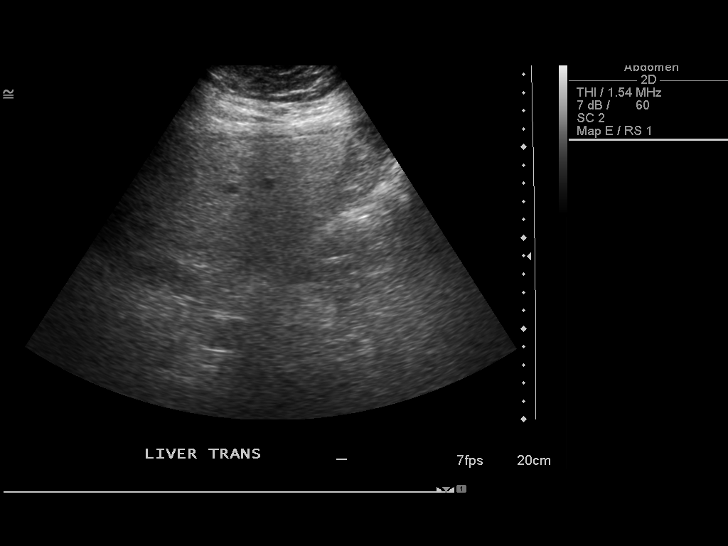

[14 of 25 positions shown; findings below may reference images not displayed]

FINDINGS: Gallbladder:  No gallstones, gallbladder wall thickening, or
pericholecystic fluid.

Common bile duct:  Normal in caliber measuring a maximum of 5.0mm.

Liver:  There is diffuse increased echogenicity of the liver and
decreased through transmission consistent with fatty infiltration.
No focal lesions or biliary dilatation.

IVC:  Not visualized due to body habitus.

Pancreas:  Limited visualization due to body habitus and overlying
bowel gas.

Spleen:  Normal size and echogenicity without focal lesions.

Right Kidney:  12.3 cm in length. Normal renal cortical thickness
and echogenicity without focal lesions or hydronephrosis.

Left Kidney:  11.9 cm in length. Normal renal cortical thickness
and echogenicity without focal lesions or hydronephrosis.

Abdominal aorta:  Normal caliber.
IMPRESSION: 1.  Normal sonographic appearance of the gallbladder and normal
caliber common bile duct.
2.  Diffuse fatty infiltration of the liver.
3.  Poor visualization of the IVC, pancreas and aorta.

## 2013-03-26 IMAGING — CR DG CHEST 2V
2 series · 2 of 2 positions shown · non-contrast
Comparison: None.

CLINICAL DATA: Pain

CHEST - 2 VIEW

[w chest pa]
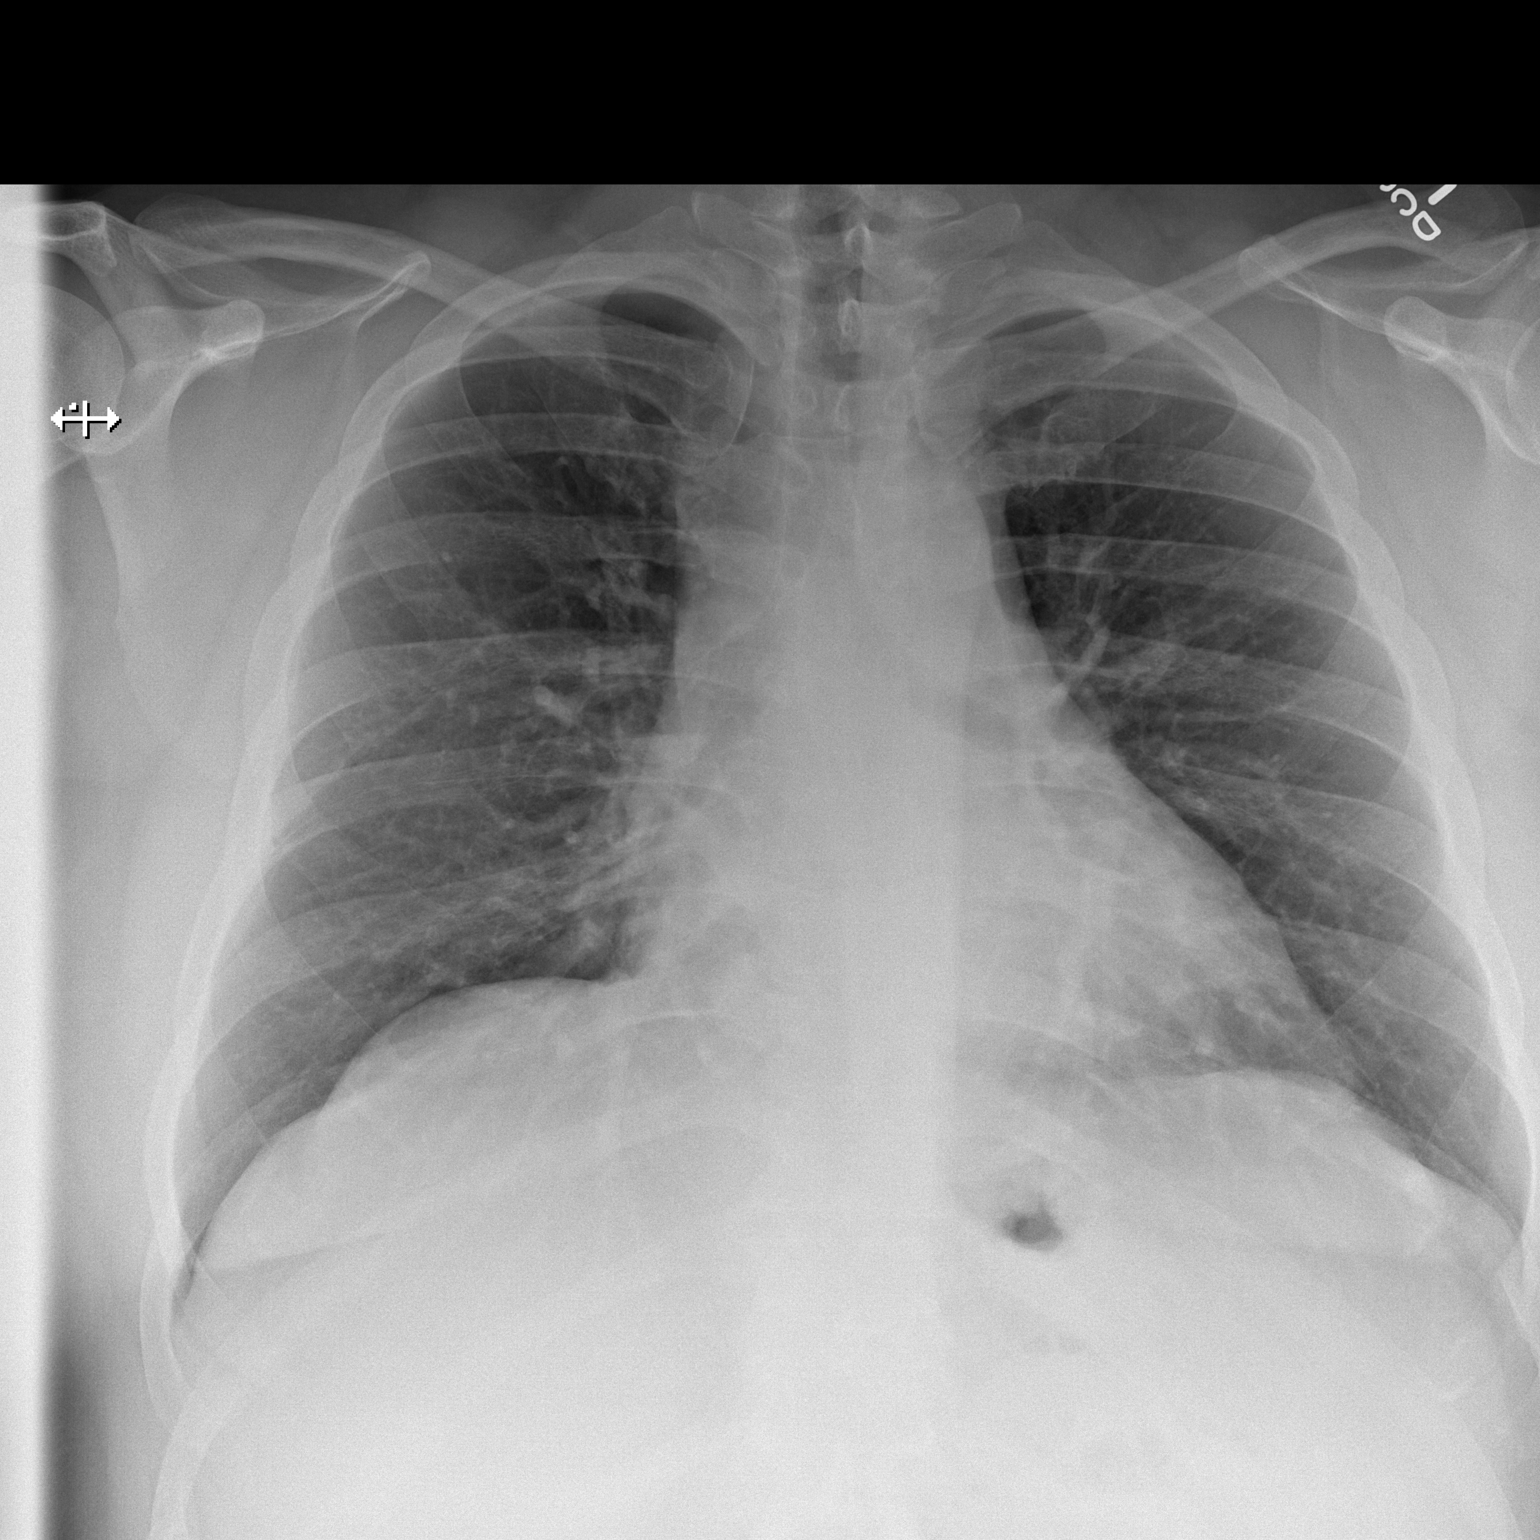

[w chest lat]
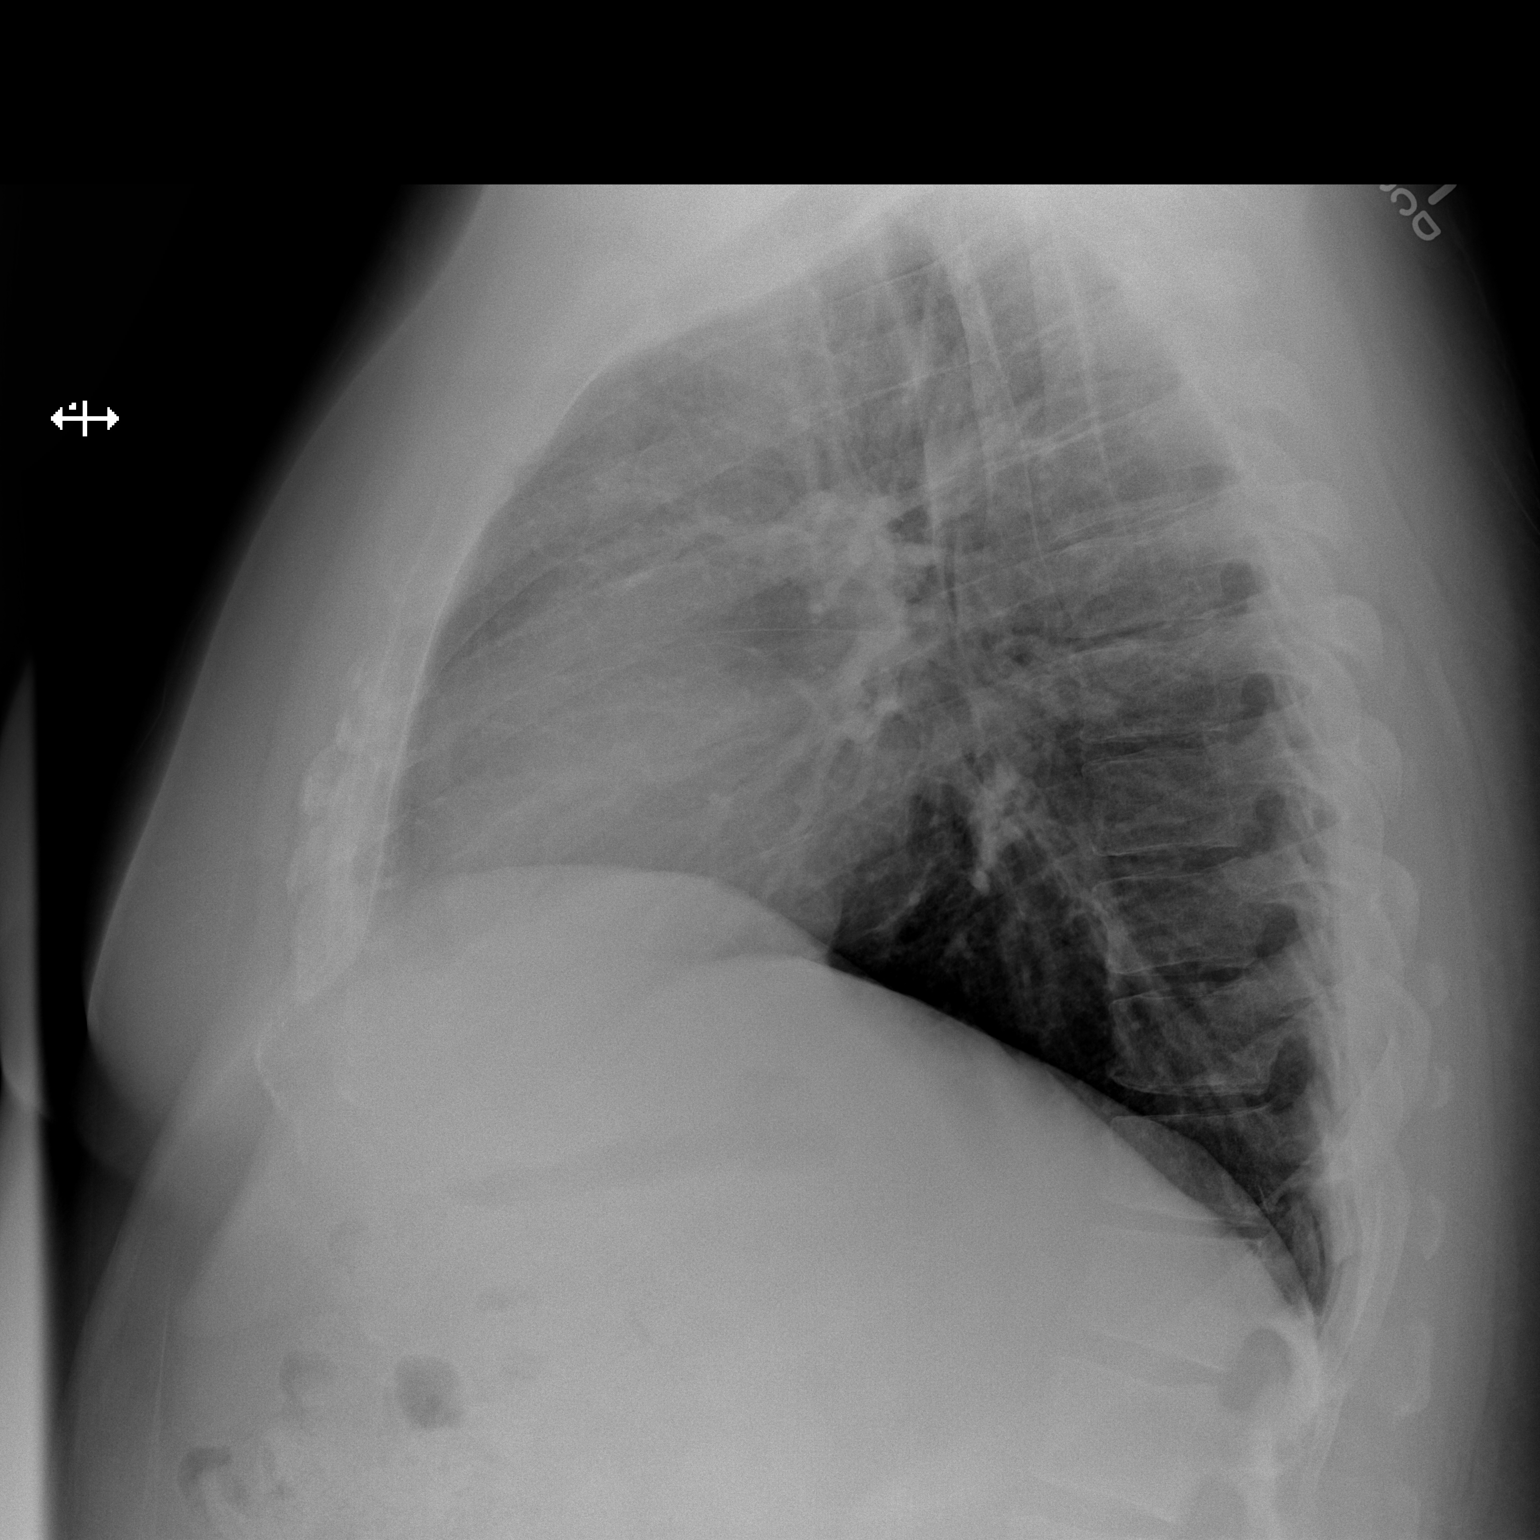

[2 of 2 positions shown; findings below may reference images not displayed]

FINDINGS: Heart size upper limits normal.  Lungs are clear.  No
effusion.  Regional bones unremarkable.
IMPRESSION: 1.  No acute disease

## 2013-05-01 ENCOUNTER — Encounter: Payer: Self-pay | Admitting: Neurology

## 2013-05-01 ENCOUNTER — Encounter (INDEPENDENT_AMBULATORY_CARE_PROVIDER_SITE_OTHER): Payer: Self-pay

## 2013-05-01 ENCOUNTER — Ambulatory Visit (INDEPENDENT_AMBULATORY_CARE_PROVIDER_SITE_OTHER): Payer: BC Managed Care – PPO | Admitting: Neurology

## 2013-05-01 VITALS — BP 134/82 | HR 84 | Temp 98.4°F | Ht 71.0 in

## 2013-05-01 DIAGNOSIS — G4731 Primary central sleep apnea: Secondary | ICD-10-CM

## 2013-05-01 DIAGNOSIS — J3489 Other specified disorders of nose and nasal sinuses: Secondary | ICD-10-CM

## 2013-05-01 DIAGNOSIS — G4733 Obstructive sleep apnea (adult) (pediatric): Secondary | ICD-10-CM

## 2013-05-01 DIAGNOSIS — J309 Allergic rhinitis, unspecified: Secondary | ICD-10-CM

## 2013-05-01 DIAGNOSIS — R0981 Nasal congestion: Secondary | ICD-10-CM

## 2013-05-01 NOTE — Patient Instructions (Addendum)
Please continue using your BiPAP regularly. While your insurance requires that you use PAP at least 4 hours each night on 70% of the nights, I recommend, that you not skip any nights and use it throughout the night if you can. Getting used to PAP does take time and patience and discipline. Untreated obstructive sleep apnea when it is moderate to severe can have an adverse impact on cardiovascular health and raise her risk for heart disease, arrhythmias, hypertension, congestive heart failure, stroke and diabetes. Untreated obstructive sleep apnea causes sleep disruption, nonrestorative sleep, and sleep deprivation. This can have an impact on your day to day functioning and cause daytime sleepiness and impairment of cognitive function, memory loss, mood disturbance, and problems focussing. Using PAP regularly can improve these symptoms.  I will try to get you a new machine.

## 2013-05-01 NOTE — Progress Notes (Signed)
Subjective:    Patient ID: Christian Shaw is a 37 y.o. male.  HPI   Interim history:   Christian Shaw is a very pleasant 37 year old right-handed gentleman with an underlying medical history of gout, allergic rhinitis, ADD, and reflux disease, who presents for followup consultation of his severe sleep apnea. He is unaccompanied today. I last saw him on 01/27/2013 at which time I talked to him about his recent split-night sleep study. He has a prior diagnosis of complex sleep apnea and was diagnosed at [facility]. I suggested that we increase his BiPAP setting from 16/11 to 17/12 cm. I suggested that we try this and if needed bring him back for a BiPAP re-titration or change him from BiPAP ST to ASV.  Today, he reports he is not waking up with a panicky feeling. He feels improved overall. He is fully compliant. He walks about 2 miles per day. The vyvance helps with appetite suppression. I reviewed his compliance data from 02/28/2013 through 04/30/2013 which is a total of 62 days during which time he used the machine every night. He had an average usage for all days of 5 hours and 48 minutes. His percent used days greater than 4 hours was 87.1%. He is residual AHI is not given on this download. He is on BiPAP ST 17/12 cm with RR of 14.   I first met him on 08/25/2012 at which time he reported a history of complex sleep apnea, which was diagnosed by a sleep specialist at [facility] years ago and he was given a BiPAP machine. His BiPAP ST was high at 19/11 cm with a backup rate of 11. His backup rate was reduced to 8. He has a history of sleep apnea surgery in February 2009 in the form of rhinoplasty.  His first sleep study was in 08/2006 showed an AHI of 108.55/h. He had a total of 27 central apneas and 6 central hypopneas. His lowest oxygen saturation was 73%. He was diagnosed with severe complex sleep apnea. He was then brought back a month later for CPAP titration and was titrated to a pressure of 15 cm with  oxygen at 2 L per minute. He had trouble tolerating CPAP and eventually had a BiPAP titration. Originally, he was placed on BiPAP of 19/11 cm with a backup rate of 11 and then changed to a backup rate of 8. He has always been compliant with treatment and I have reviewed old or compliance data as well. I suggested that he come back for a full night BiPAP titration study and he had a split-night study on 08/30/2012 which I reviewed with him last time. Sleep efficiency was reduced severely at 32.6% with a latency to sleep of only 7.5 minutes but wake after sleep onset of 117.5 minutes with moderate sleep fragmentation noted. He had a markedly elevated arousal index of 94.4 per hour and a markedly increased percentage of stage I sleep at 81.1% and very low percentage of stage II sleep and absence of slow-wave and REM sleep. He had a total of 17 obstructive and 33 central apneas as well as 27 obstructive and 2 central hypopneas rendering and elevated AHI of 85.6 per hour. His baseline oxygen saturation was 92% with a nadir of 75%. He was then titrated on BiPAP starting at 14/10 cm and titrating up to 16/12 cm. He had an AHI of 9.1 on that pressure. REM sleep was achieved but no supine REM sleep. His post PAP oxygen saturation was 93% on average  with a nadir of 87%.  I reviewed his compliance data from 08/29/2012 through 01/26/2013 which is a total of 151 days during which time he used it every day. His percent used days greater than 4 hours was 88.7%. Average usage for all days was 5 h, 37 minutes. His compliance data from 10/17/2012 through 01/26/2013 which is a total of 102 days showed daily usage as well with an average usage of 5 hours and 36 minutes and percent used days greater than 4 hours of 89.2% indicating very good compliance. He had intermittent severe nasal congestion, for which he takes prn Claritin, nasonex nightly. He has not been doing nasal rinses. He has gained a little weight, but is back on track.  He has had some stressors written regards to his wife's health. This has improved lately.   His Past Medical History Is Significant For: Past Medical History  Diagnosis Date  . Sleep apnea   . Gout   . Childhood asthma   . ADHD (attention deficit hyperactivity disorder)   . CSA (central sleep apnea) 08/25/2012  . OSA (obstructive sleep apnea) 08/25/2012  . ADD (attention deficit disorder) 08/25/2012  . Allergic rhinitis 08/25/2012    His Past Surgical History Is Significant For: Past Surgical History  Procedure Laterality Date  . Wisdom tooth extraction      His Family History Is Significant For: No family history on file.  His Social History Is Significant For: History   Social History  . Marital Status: Unknown    Spouse Name: N/A    Number of Children: N/A  . Years of Education: N/A   Social History Main Topics  . Smoking status: Never Smoker   . Smokeless tobacco: None  . Alcohol Use: Yes  . Drug Use: No  . Sexual Activity:    Other Topics Concern  . None   Social History Narrative  . None    His Allergies Are:  No Known Allergies:   His Current Medications Are:  Outpatient Encounter Prescriptions as of 05/01/2013  Medication Sig  . colchicine 0.6 MG tablet Take 0.6 mg by mouth as needed.  Marland Kitchen ibuprofen (ADVIL,MOTRIN) 200 MG tablet Take 400 mg by mouth every 6 (six) hours as needed. pain  . lisdexamfetamine (VYVANSE) 60 MG capsule Take 60 mg by mouth every morning.  . Loratadine (CLARITIN) 10 MG CAPS Take 10 mg by mouth daily.  . mometasone (NASONEX) 50 MCG/ACT nasal spray Place 2 sprays into the nose daily.  :  Review of Systems:  Out of a complete 14 point review of systems, all are reviewed and negative with the exception of these symptoms as listed below:   Review of Systems  Constitutional: Negative.   HENT: Negative.   Eyes: Negative.   Respiratory: Negative.   Cardiovascular: Negative.   Gastrointestinal: Negative.   Endocrine: Negative.    Genitourinary: Negative.   Musculoskeletal: Negative.   Skin: Negative.   Allergic/Immunologic: Negative.   Neurological: Negative.   Hematological: Negative.   Psychiatric/Behavioral: Negative.   All other systems reviewed and are negative.    Objective:  Neurologic Exam  Physical Exam Physical Examination:   Filed Vitals:   05/01/13 1027  BP: 134/82  Pulse: 84  Temp: 98.4 F (36.9 C)   General Examination: The patient is a very pleasant 37 y.o. male in no acute distress. He appears well-developed and well-nourished and well groomed. He is significantly obese.  HEENT: Normocephalic, atraumatic, pupils are equal, round and reactive to light  and accommodation. Extraocular tracking is good without nystagmus noted. Normal smooth pursuit is noted. Fundoscopy is normal. Hearing is grossly intact. Face is symmetric with normal facial animation and normal facial sensation. Speech is clear with no dysarthria noted. There is no hypophonia. There is no lip, neck or jaw tremor. Neck is supple with full range of motion. There are no carotid bruits on auscultation. Oropharynx exam reveals normal findings. Moderate airway crowding is noted. Mallampati is class III. Tongue protrudes centrally and palate elevates symmetrically. Tonsils are 1+. Nasal inspection shows no mucosal bogginess but significant redness of his mucosal membranes.  Chest: is clear to auscultation without wheezing, rhonchi or crackles noted.  Heart: sounds are regular and normal without murmurs, rubs or gallops noted.   Abdomen: is soft, non-tender and non-distended with normal bowel sounds appreciated on auscultation.  Extremities: There is no pitting edema in the distal lower extremities bilaterally.   Skin: is warm and dry with no trophic changes noted.  Musculoskeletal: exam reveals no obvious joint deformities, tenderness or joint swelling or erythema.  Neurologically:  Mental status: The patient is awake, alert  and oriented in all 4 spheres. His memory, attention, language and knowledge are appropriate. There is no aphasia, agnosia, apraxia or anomia. Speech is clear with normal prosody and enunciation. Thought process is linear. Mood is congruent and affect is normal.  Cranial nerves are as described above under HEENT exam. In addition, shoulders show equal shoulder height noted. Motor exam: Normal bulk, strength and tone is noted. There is no drift, tremor or rebound. Fine motor skills are intact.  There is no dysmetria or intention tremor. There is no truncal or gait ataxia.  Sensory exam is intact to light touch.  Gait, station and balance are unremarkable. No veering to one side is noted. No leaning to one side. Posture is age-appropriate and stance is narrow based. No problems turning are noted.      Assessment and Plan:   In summary, Byan Poplaski is a very pleasant 37 year old male with a history of severe complex sleep apnea, status post sleep apnea surgery in the past and recent confirmation of his severe sleep disorder breathing during a split-night sleep study. He is now on BiPAP ST 17/12 cm with a backup rate of 14. He has felt improvement in terms of waking up with a panic sensation which is now virtually gone. He also feels better rested. Unfortunately his machine is over, over 16 years old and does not give [place] a good breakdown of his residual AHI. I really would like for him to have an updated machine that gives is better compliance data. To that end, I have placed an order for a new BiPAP machine at the same settings. He uses a medium fullface mask which he tolerates. He does have intermittent nasal congestion. I would like to reevaluate him with compliance data in about 6 months, sooner if the need arises. He is congratulated on being very compliant with the use of BiPAP and is also commended on trying to lose weight, and encouraged to continue using BiPAP regularly. Increasing the humidifier  setting has helped as well. His exam is stable. He was in agreement with the plan. I asked him to call with any interim questions, concerns, problems or updates.  Most of my 25 minute visit today was spent in counseling and coordination of care, reviewing test results and reviewing medication.

## 2013-05-15 ENCOUNTER — Encounter: Payer: Self-pay | Admitting: Neurology

## 2013-06-22 ENCOUNTER — Telehealth: Payer: Self-pay | Admitting: Neurology

## 2013-06-22 NOTE — Telephone Encounter (Signed)
Patient called stating he needs his CPAP setting to be changed from 17 up to 15 up. Patient has a new machine and 17 is too much air.  Patient stated it seems like it's not letting him take deep breathes.

## 2013-07-07 ENCOUNTER — Encounter: Payer: Self-pay | Admitting: Neurology

## 2013-08-23 ENCOUNTER — Encounter: Payer: Self-pay | Admitting: Neurology

## 2013-08-31 NOTE — Progress Notes (Signed)
Quick Note:  I reviewed the patient's BiPAP compliance data from 07/07/2013 to 08/05/2013, which is a total of 30 days, during which time the patient used BiPAP every day. The average usage for all days was 6 hours and 7 minutes. The percent used days greater than 4 hours was 97 %, indicating excellent compliance. The residual AHI was 5.2 per hour, indicating an adequate treatment pressure of 17/12 cwp with a respiratory rate of 14. Air leak from the mask was low. I will review this data with the patient at the next office visit, which is currently routinely scheduled for 10/23/2013 at 3:30 PM, provide feedback and additional troubleshooting if need be.  Huston FoleySaima Sonya Gunnoe, MD, PhD [facility] ([facility])   ______

## 2013-09-14 ENCOUNTER — Encounter: Payer: Self-pay | Admitting: Neurology

## 2013-10-23 ENCOUNTER — Encounter: Payer: Self-pay | Admitting: Neurology

## 2013-10-23 ENCOUNTER — Ambulatory Visit (INDEPENDENT_AMBULATORY_CARE_PROVIDER_SITE_OTHER): Payer: BC Managed Care – PPO | Admitting: Neurology

## 2013-10-23 ENCOUNTER — Encounter (INDEPENDENT_AMBULATORY_CARE_PROVIDER_SITE_OTHER): Payer: Self-pay

## 2013-10-23 VITALS — BP 120/81 | HR 84 | Temp 97.8°F | Ht 71.0 in | Wt 313.0 lb

## 2013-10-23 DIAGNOSIS — G4731 Primary central sleep apnea: Secondary | ICD-10-CM

## 2013-10-23 DIAGNOSIS — F988 Other specified behavioral and emotional disorders with onset usually occurring in childhood and adolescence: Secondary | ICD-10-CM

## 2013-10-23 DIAGNOSIS — J309 Allergic rhinitis, unspecified: Secondary | ICD-10-CM

## 2013-10-23 DIAGNOSIS — G4733 Obstructive sleep apnea (adult) (pediatric): Secondary | ICD-10-CM

## 2013-10-23 NOTE — Patient Instructions (Addendum)
Please continue using your BiPAP regularly. While your insurance requires that you use PAP at least 4 hours each night on 70% of the nights, I recommend, that you not skip any nights and use it throughout the night if you can. Getting used to PAP and staying with the treatment long term does take time and patience and discipline. Untreated obstructive sleep apnea when it is moderate to severe can have an adverse impact on cardiovascular health and raise her risk for heart disease, arrhythmias, hypertension, congestive heart failure, stroke and diabetes. Untreated obstructive sleep apnea causes sleep disruption, nonrestorative sleep, and sleep deprivation. This can have an impact on your day to day functioning and cause daytime sleepiness and impairment of cognitive function, memory loss, mood disturbance, and problems focussing. Using PAP regularly can improve these symptoms.   Keep up the good work! I will see you back in 6 months for sleep apnea check up.

## 2013-10-23 NOTE — Progress Notes (Signed)
Subjective:    Patient ID: Christian Shaw is a 38 y.o. male.  HPI    Interim history:  Christian Shaw is a 38 year old right-handed gentleman with an underlying medical history of obesity, gout, allergic rhinitis, ADD, and reflux disease, who presents for followup consultation of his complex severe sleep apnea. He is unaccompanied today. I last saw him on 05/01/2013, at which time I ordered a new BiPAP machine for him because his machine was over 83 years old. He reported doing well at the time in order at the same settings. In the interim, he called back in February reporting that the pressure was too high. I reviewed the patient's BiPAP compliance data from 07/07/2013 to 08/05/2013, which is a total of 30 days, during which time the patient used BiPAP every day. The average usage for all days was 6 hours and 7 minutes. The percent used days greater than 4 hours was 97 %, indicating excellent compliance. The residual AHI was 5.2 per hour, indicating an adequate treatment pressure of 17/12 cwp with a respiratory rate of 14. Air leak from the mask was low.   Today, I reviewed his compliance data from 09/22/2013 through 10/21/2013 which is a total of 30 days during which time he used his BiPAP every day, with percent used days greater than 4 hours at 97%, indicating excellent compliance. Residual AHI was 4.2 per hour and leak was very low with the 95th percentile at 3.2 lpm.   Today, he reports that he had good nights and bad nights, depending on allergy symptoms and reflex symptoms. He uses nasonex daily and Claritin prn, and TUMS OTC. He has been on Vyvanse 70 mg daily per PCP. Overall, he has been well.   I saw him on 01/27/2013 at which time I talked to him about his split-night sleep study. He has a prior diagnosis of complex sleep apnea and was diagnosed at [facility]. I suggested that we increase his BiPAP setting from 16/11 to 17/12 cm. I suggested that we try this and if needed bring him back for a  BiPAP re-titration or change him from BiPAP ST to ASV.  I reviewed his compliance data from 02/28/2013 through 04/30/2013 (62 days), during which time he used the machine every night. He had an average usage for all days of 5 hours and 48 minutes. His percent used days greater than 4 hours was 87.1%. He is residual AHI is not given on this download. He has been on BiPAP ST 17/12 cm with RR of 14.  I first met him on 08/25/2012 at which time he reported a history of complex sleep apnea, which was diagnosed by a sleep specialist at [facility] years ago and he was given a BiPAP machine. His BiPAP ST was high at 19/11 cm with a backup rate of 11. His backup rate was reduced to 8. He has a history of sleep apnea surgery in February 2009 in the form of rhinoplasty.  His first sleep study was in 08/2006 showed an AHI of 108.55/h. He had a total of 27 central apneas and 6 central hypopneas. His lowest oxygen saturation was 73%. He was diagnosed with severe complex sleep apnea. He was then brought back a month later for CPAP titration and was titrated to a pressure of 15 cm with oxygen at 2 L per minute. He had trouble tolerating CPAP and eventually had a BiPAP titration. Originally, he was placed on BiPAP of 19/11 cm with a backup rate of 11 and then  changed to a backup rate of 8. He has always been compliant with treatment and I have reviewed old or compliance data as well.  I suggested that he come back for a full night BiPAP titration study and he had a split-night study on 08/30/2012 which I reviewed with him last time. Sleep efficiency was reduced severely at 32.6% with a latency to sleep of only 7.5 minutes but wake after sleep onset of 117.5 minutes with moderate sleep fragmentation noted. He had a markedly elevated arousal index of 94.4 per hour and a markedly increased percentage of stage I sleep at 81.1% and very low percentage of stage II sleep and absence of slow-wave and REM sleep. He had a total of 17 obstructive  and 33 central apneas as well as 27 obstructive and 2 central hypopneas rendering and elevated AHI of 85.6 per hour. His baseline oxygen saturation was 92% with a nadir of 75%. He was then titrated on BiPAP starting at 14/10 cm and titrating up to 16/12 cm. He had an AHI of 9.1 on that pressure. REM sleep was achieved but no supine REM sleep. His post PAP oxygen saturation was 93% on average with a nadir of 87%.  I reviewed his compliance data from 08/29/2012 through 01/26/2013 (151 days), during which time he used it every day. His percent used days greater than 4 hours was 88.7%. Average usage for all days was 5 h, 37 minutes. His compliance data from 10/17/2012 through 01/26/2013 showed daily usage as well with an average usage of 5 hours and 36 minutes and percent used days greater than 4 hours of 89.2% indicating very good compliance. He had intermittent severe nasal congestion, for which he takes prn Claritin, nasonex nightly. He has had some stressors written regards to his wife's health in the past. This improved.   His Past Medical History Is Significant For: Past Medical History  Diagnosis Date  . Sleep apnea   . Gout   . Childhood asthma   . ADHD (attention deficit hyperactivity disorder)   . CSA (central sleep apnea) 08/25/2012  . OSA (obstructive sleep apnea) 08/25/2012  . ADD (attention deficit disorder) 08/25/2012  . Allergic rhinitis 08/25/2012    His Past Surgical History Is Significant For: Past Surgical History  Procedure Laterality Date  . Wisdom tooth extraction      His Family History Is Significant For: No family history on file.  His Social History Is Significant For: History   Social History  . Marital Status: Unknown    Spouse Name: N/A    Number of Children: N/A  . Years of Education: N/A   Social History Main Topics  . Smoking status: Never Smoker   . Smokeless tobacco: None  . Alcohol Use: Yes  . Drug Use: No  . Sexual Activity:    Other Topics  Concern  . None   Social History Narrative  . None    His Allergies Are:  No Known Allergies:   His Current Medications Are:  Outpatient Encounter Prescriptions as of 10/23/2013  Medication Sig  . colchicine 0.6 MG tablet Take 0.6 mg by mouth as needed.  Marland Kitchen ibuprofen (ADVIL,MOTRIN) 200 MG tablet Take 400 mg by mouth every 6 (six) hours as needed. pain  . lisdexamfetamine (VYVANSE) 60 MG capsule Take 60 mg by mouth every morning.  . Loratadine (CLARITIN) 10 MG CAPS Take 10 mg by mouth daily.  . mometasone (NASONEX) 50 MCG/ACT nasal spray Place 2 sprays into the nose daily.  :  Review of Systems:  Out of a complete 14 point review of systems, all are reviewed and negative with the exception of these symptoms as listed below:  Review of Systems  Constitutional: Negative.   HENT: Negative.   Eyes: Negative.   Respiratory: Negative.   Cardiovascular: Negative.   Gastrointestinal: Negative.   Endocrine: Negative.   Genitourinary: Negative.   Musculoskeletal: Negative.   Skin: Negative.   Allergic/Immunologic: Negative.   Neurological: Negative.   Hematological: Negative.   Psychiatric/Behavioral: Negative.     Objective:  Neurologic Exam  Physical Exam Physical Examination:   Filed Vitals:   10/23/13 1532  BP: 120/81  Pulse: 84  Temp: 97.8 F (36.6 C)   General Examination: The patient is a very pleasant 38 y.o. male in no acute distress. He appears well-developed and well-nourished and well groomed. He is significantly obese.  HEENT: Normocephalic, atraumatic, pupils are equal, round and reactive to light and accommodation. Extraocular tracking is good without nystagmus noted. He has very slight corneal clouding (?). He states, he has seen an ophthalmologist for this. Normal smooth pursuit is noted. Fundoscopy is normal. Hearing is grossly intact. Face is symmetric with normal facial animation and normal facial sensation. Speech is clear with no dysarthria noted. There  is no hypophonia. There is no lip, neck or jaw tremor. Neck is supple with full range of motion. There are no carotid bruits on auscultation. Oropharynx exam reveals normal findings. Moderate airway crowding is noted. Mallampati is class III. Tongue protrudes centrally and palate elevates symmetrically. Tonsils are 1+. Nasal inspection shows no mucosal bogginess but significant redness of his mucosal membranes.  Chest: is clear to auscultation without wheezing, rhonchi or crackles noted.  Heart: sounds are regular and normal without murmurs, rubs or gallops noted.   Abdomen: is soft, non-tender and non-distended with normal bowel sounds appreciated on auscultation.  Extremities: There is no pitting edema in the distal lower extremities bilaterally.   Skin: is warm and dry with no trophic changes noted.  Musculoskeletal: exam reveals no obvious joint deformities, tenderness or joint swelling or erythema.  Neurologically:  Mental status: The patient is awake, alert and oriented in all 4 spheres. His memory, attention, language and knowledge are appropriate. There is no aphasia, agnosia, apraxia or anomia. Speech is clear with normal prosody and enunciation. Thought process is linear. Mood is congruent and affect is normal.  Cranial nerves are as described above under HEENT exam. In addition, shoulders show equal shoulder height noted. Motor exam: Normal bulk, strength and tone is noted. There is no drift, tremor or rebound. Fine motor skills are intact.  There is no dysmetria or intention tremor. There is no truncal or gait ataxia.  Sensory exam is intact to light touch, PP and temperature sense.   Gait, station and balance are unremarkable. No veering to one side is noted. No leaning to one side. Posture is age-appropriate and stance is narrow based. No problems turning are noted.      Assessment and Plan:   In summary, Christian Shaw is a very pleasant 38 year old male with a history of  severe complex sleep apnea, status post sleep apnea surgery in the past and confirmation of his severe sleep disordered breathing during a split-night sleep study. He is n continues to be compliant with BiPAP ST 17/12 cm with a backup rate of 14. He has felt improvement in his sleep related symptoms which included waking up with a panic sensation and he feels better rested.  he has a new BiPAP machine and has been using it regularly without any major problems. His residual AHI is less than 5, his leak is low. He had a new mask which fits better. Overall he is doing well and his exam is stable. He has gained some weight and is encouraged to try to lose weight . He is congratulated on being very compliant with the use of BiPAP and is also commended on trying to lose weight, and encouraged to continue using BiPAP regularly. He was in agreement with the plan. I asked him to call with any interim questions, concerns, problems or updates.

## 2014-04-20 ENCOUNTER — Encounter: Payer: Self-pay | Admitting: Neurology

## 2014-04-23 ENCOUNTER — Ambulatory Visit (INDEPENDENT_AMBULATORY_CARE_PROVIDER_SITE_OTHER): Payer: BC Managed Care – PPO | Admitting: Neurology

## 2014-04-23 ENCOUNTER — Encounter: Payer: Self-pay | Admitting: Neurology

## 2014-04-23 VITALS — BP 107/67 | HR 88 | Temp 98.1°F | Ht 71.0 in | Wt 324.0 lb

## 2014-04-23 DIAGNOSIS — F988 Other specified behavioral and emotional disorders with onset usually occurring in childhood and adolescence: Secondary | ICD-10-CM

## 2014-04-23 DIAGNOSIS — R0981 Nasal congestion: Secondary | ICD-10-CM

## 2014-04-23 DIAGNOSIS — F909 Attention-deficit hyperactivity disorder, unspecified type: Secondary | ICD-10-CM

## 2014-04-23 DIAGNOSIS — G4731 Primary central sleep apnea: Secondary | ICD-10-CM

## 2014-04-23 DIAGNOSIS — G4733 Obstructive sleep apnea (adult) (pediatric): Secondary | ICD-10-CM

## 2014-04-23 NOTE — Patient Instructions (Signed)
Keep up the good work! I will see you back in 6 months for sleep apnea check up.

## 2014-04-23 NOTE — Progress Notes (Signed)
Subjective:    Patient ID: Christian Shaw is a 38 y.o. male.  HPI     Interim history:  Christian Shaw is a 38 year old right-handed gentleman with an underlying medical history of obesity, gout, allergic rhinitis, ADD, and reflux disease, who presents for followup consultation of his complex severe sleep apnea. He is unaccompanied today. I last saw him on 10/23/2013, at which time he reported allergy symptoms. Overall, he was doing well with BiPAP. He was somewhat Vyvanse 70 mg daily.  Today, I reviewed his BiPAP compliance data from 03/20/2014 through 04/18/2014 which is a total of 30 days during which time he used his machine every night. Percent used days greater than 4 hours was 93%, indicating excellent compliance, average usage of 6 hours and 45 minutes, residual AHI good at 3.3 per hour, leak low for the 95th percentile at 5.4 L/m, pressure at 17/12 with a rate of 14.  Today, he reports that he had difficulty tolerating his BiPAP as he had a cold with some residual nasal congestion at this time. He does not typically get the flu shot. Overall, he feels he is doing fine with BiPAP therapy and is compliant with treatment. He has had no changes in his medications and sees his primary care physician twice yearly.  I saw him on 05/01/2013, at which time I ordered a new BiPAP machine for him because his machine was over 88 years old. He reported doing well at the time in order at the same settings. In the interim, he called back in February reporting that the pressure was too high. I reviewed the patient's BiPAP compliance data from 07/07/2013 to 08/05/2013, which is a total of 30 days, during which time the patient used BiPAP every day. The average usage for all days was 6 hours and 7 minutes. The percent used days greater than 4 hours was 97 %, indicating excellent compliance. The residual AHI was 5.2 per hour, indicating an adequate treatment pressure of 17/12 cwp with a respiratory rate of  14. Air leak from the mask was low.   I reviewed his compliance data from 09/22/2013 through 10/21/2013 which is a total of 30 days during which time he used his BiPAP every day, with percent used days greater than 4 hours at 97%, indicating excellent compliance. Residual AHI was 4.2 per hour and leak was very low with the 95th percentile at 3.2 lpm.   I saw him on 01/27/2013 at which time I talked to him about his split-night sleep study. He has a prior diagnosis of complex sleep apnea and was diagnosed at [facility]. I suggested that we increase his BiPAP setting from 16/11 to 17/12 cm. I suggested that we try this and if needed bring him back for a BiPAP re-titration or change him from BiPAP ST to ASV.   I reviewed his compliance data from 02/28/2013 through 04/30/2013 (62 days), during which time he used the machine every night. He had an average usage for all days of 5 hours and 48 minutes. His percent used days greater than 4 hours was 87.1%. He is residual AHI is not given on this download. He has been on BiPAP ST 17/12 cm with RR of 14.   I first met him on 08/25/2012 at which time he reported a history of complex sleep apnea, which was diagnosed by a sleep specialist at [facility] years ago and he was given a BiPAP machine. His BiPAP ST was high at 19/11 cm with a backup rate  of 11. His backup rate was reduced to 8. He has a history of sleep apnea surgery in February 2009 in the form of rhinoplasty.   His first sleep study was in 08/2006 showed an AHI of 108.55/h. He had a total of 27 central apneas and 6 central hypopneas. His lowest oxygen saturation was 73%. He was diagnosed with severe complex sleep apnea. He was then brought back a month later for CPAP titration and was titrated to a pressure of 15 cm with oxygen at 2 L per minute. He had trouble tolerating CPAP and eventually had a BiPAP titration. Originally, he was placed on BiPAP of 19/11 cm with a backup rate of 11 and then changed to a backup rate of 8.  He has always been compliant with treatment and I have reviewed old or compliance data as well.   I suggested that he come back for a full night BiPAP titration study and he had a split-night study on 08/30/2012 which I reviewed with him last time. Sleep efficiency was reduced severely at 32.6% with a latency to sleep of only 7.5 minutes but wake after sleep onset of 117.5 minutes with moderate sleep fragmentation noted. He had a markedly elevated arousal index of 94.4 per hour and a markedly increased percentage of stage I sleep at 81.1% and very low percentage of stage II sleep and absence of slow-wave and REM sleep. He had a total of 17 obstructive and 33 central apneas as well as 27 obstructive and 2 central hypopneas rendering and elevated AHI of 85.6 per hour. His baseline oxygen saturation was 92% with a nadir of 75%. He was then titrated on BiPAP starting at 14/10 cm and titrating up to 16/12 cm. He had an AHI of 9.1 on that pressure. REM sleep was achieved but no supine REM sleep. His post PAP oxygen saturation was 93% on average with a nadir of 87%.   I reviewed his compliance data from 08/29/2012 through 01/26/2013 (151 days), during which time he used it every day. His percent used days greater than 4 hours was 88.7%. Average usage for all days was 5 h, 37 minutes. His compliance data from 10/17/2012 through 01/26/2013 showed daily usage as well with an average usage of 5 hours and 36 minutes and percent used days greater than 4 hours of 89.2% indicating very good compliance. He had intermittent severe nasal congestion, for which he takes prn Claritin, nasonex nightly. He has had some stressors written regards to his wife's health in the past. This improved.   His Past Medical History Is Significant For: Past Medical History  Diagnosis Date  . Sleep apnea   . Gout   . Childhood asthma   . ADHD (attention deficit hyperactivity disorder)   . CSA (central sleep apnea) 08/25/2012  . OSA  (obstructive sleep apnea) 08/25/2012  . ADD (attention deficit disorder) 08/25/2012  . Allergic rhinitis 08/25/2012    His Past Surgical History Is Significant For: Past Surgical History  Procedure Laterality Date  . Wisdom tooth extraction      His Family History Is Significant For: Family History  Problem Relation Age of Onset  . High Cholesterol Mother   . High Cholesterol Father   . Gout Father     His Social History Is Significant For: History   Social History  . Marital Status: Unknown    Spouse Name: N/A    Number of Children: N/A  . Years of Education: N/A   Social History Main  Topics  . Smoking status: Never Smoker   . Smokeless tobacco: None  . Alcohol Use: Yes  . Drug Use: No  . Sexual Activity: None   Other Topics Concern  . None   Social History Narrative    His Allergies Are:  No Known Allergies:   His Current Medications Are:  Outpatient Encounter Prescriptions as of 04/23/2014  Medication Sig  . colchicine 0.6 MG tablet Take 0.6 mg by mouth as needed.  Marland Kitchen ibuprofen (ADVIL,MOTRIN) 200 MG tablet Take 400 mg by mouth every 6 (six) hours as needed. pain  . Loratadine (CLARITIN) 10 MG CAPS Take 10 mg by mouth daily.  . mometasone (NASONEX) 50 MCG/ACT nasal spray Place 2 sprays into the nose daily.  . naproxen sodium (ANAPROX) 550 MG tablet   . VYVANSE 70 MG capsule Take 70 mg by mouth every morning.  . [DISCONTINUED] lisdexamfetamine (VYVANSE) 60 MG capsule Take 60 mg by mouth every morning.  :  Review of Systems:  Out of a complete 14 point review of systems, all are reviewed and negative with the exception of these symptoms as listed below:   Review of Systems  Respiratory:       Cold and congestion     Objective:  Neurologic Exam  Physical Exam Physical Examination:   Filed Vitals:   04/23/14 1427  BP: 107/67  Pulse: 88  Temp: 98.1 F (36.7 C)   General Examination: The patient is a very pleasant 38 y.o. male in no acute distress.  He appears well-developed and well-nourished and well groomed. He is obese.  HEENT: Normocephalic, atraumatic, pupils are equal, round and reactive to light and accommodation. Extraocular tracking is good without nystagmus noted. He has very slightly clouded lenses. Fundoscopy is normal. Hearing is grossly intact. Tympanic membranes are clear bilaterally. Face is symmetric with normal facial animation and normal facial sensation. Speech is clear with no dysarthria noted. There is no hypophonia. There is no lip, neck or jaw tremor. Neck is supple with full range of motion. There are no carotid bruits on auscultation. Oropharynx exam reveals moderate mouth dryness and mild pharyngeal erythema. Moderate airway crowding is noted. Mallampati is class III. Tongue protrudes centrally and palate elevates symmetrically. Tonsils are 1+. Nasal inspection shows no mucosal bogginess but significant redness of his mucosal membranes.  Chest: is clear to auscultation without wheezing, rhonchi or crackles noted.  Heart: sounds are regular and normal without murmurs, rubs or gallops noted.   Abdomen: is soft, non-tender and non-distended with normal bowel sounds appreciated on auscultation.  Extremities: There is no pitting edema in the distal lower extremities bilaterally.   Skin: is warm and dry with no trophic changes noted.  Musculoskeletal: exam reveals no obvious joint deformities, tenderness or joint swelling or erythema.  Neurologically:  Mental status: The patient is awake, alert and oriented in all 4 spheres. His memory, attention, language and knowledge are appropriate. There is no aphasia, agnosia, apraxia or anomia. Speech is clear with normal prosody and enunciation. Thought process is linear. Mood is congruent and affect is normal.  Cranial nerves are as described above under HEENT exam. In addition, shoulders show equal shoulder height noted. Motor exam: Normal bulk, strength and tone is noted.  There is no drift, tremor or rebound. Fine motor skills are intact. Romberg is negative. Reflexes are 1+.  There is no dysmetria or intention tremor. There is no truncal or gait ataxia.  Sensory exam is intact to light touch, PP and temperature  sense.   Gait, station and balance are unremarkable. No veering to one side is noted. No leaning to one side. Posture is age-appropriate and stance is narrow based. No problems turning are noted.  tandem walks unremarkable.     Assessment and Plan:   In summary, Christian Shaw is a very pleasant 38 year old male with a history of severe complex sleep apnea, status post sleep apnea surgery in the past and confirmation of his severe sleep disordered breathing during a split-night sleep study in 4/14. He continues to be compliant with BiPAP ST 17/12 cm with a backup rate of 14. He has felt improvement in his sleep related symptoms which included waking up with a panic sensation and he feels better rested since on BiPAP. He received a new BiPAP machine and has been using it regularly without any major problems. His residual AHI is less than 5, his leak is low. He received a new mask which fits better. Overall he is doing well and his exam is stable. He is encouraged to try to lose weight . He is congratulated on being very compliant with the use of BiPAP and is also commended on trying to lose weight, and encouraged to continue using BiPAP regularly.  I reiterated his sleep study results and we went over his compliance data in detail together. I would like to see him back in 6 months from now, sooner if need be. I answered all his questions today and he was in agreement with the plan. I asked him to call with any interim questions, concerns, problems or updates.

## 2014-04-30 ENCOUNTER — Emergency Department (HOSPITAL_COMMUNITY)
Admission: EM | Admit: 2014-04-30 | Discharge: 2014-04-30 | Disposition: A | Discharge status: Home / Self Care | Payer: BC Managed Care – PPO | Attending: Emergency Medicine | Admitting: Emergency Medicine

## 2014-04-30 ENCOUNTER — Encounter (HOSPITAL_COMMUNITY): Payer: Self-pay | Admitting: Emergency Medicine

## 2014-04-30 DIAGNOSIS — Z8669 Personal history of other diseases of the nervous system and sense organs: Secondary | ICD-10-CM | POA: Diagnosis not present

## 2014-04-30 DIAGNOSIS — F909 Attention-deficit hyperactivity disorder, unspecified type: Secondary | ICD-10-CM | POA: Insufficient documentation

## 2014-04-30 DIAGNOSIS — M109 Gout, unspecified: Secondary | ICD-10-CM | POA: Insufficient documentation

## 2014-04-30 DIAGNOSIS — R252 Cramp and spasm: Secondary | ICD-10-CM | POA: Diagnosis present

## 2014-04-30 DIAGNOSIS — K5900 Constipation, unspecified: Secondary | ICD-10-CM | POA: Insufficient documentation

## 2014-04-30 DIAGNOSIS — M5431 Sciatica, right side: Secondary | ICD-10-CM | POA: Diagnosis not present

## 2014-04-30 DIAGNOSIS — J45909 Unspecified asthma, uncomplicated: Secondary | ICD-10-CM | POA: Diagnosis not present

## 2014-04-30 NOTE — ED Notes (Signed)
Pt c/o right leg spasm that started yesterday and now radiating down to his right foot.  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

## 2014-04-30 NOTE — Discharge Instructions (Signed)
Sciatica Sciatica is pain, weakness, numbness, or tingling along the path of the sciatic nerve. The nerve starts in the lower back and runs down the back of each leg. The nerve controls the muscles in the lower leg and in the back of the knee, while also providing sensation to the back of the thigh, lower leg, and the sole of your foot. Sciatica is a symptom of another medical condition. For instance, nerve damage or certain conditions, such as a herniated disk or bone spur on the spine, pinch or put pressure on the sciatic nerve. This causes the pain, weakness, or other sensations normally associated with sciatica. Generally, sciatica only affects one side of the body. CAUSES   Herniated or slipped disc.  Degenerative disk disease.  A pain disorder involving the narrow muscle in the buttocks (piriformis syndrome).  Pelvic injury or fracture.  Pregnancy.  Tumor (rare). SYMPTOMS  Symptoms can vary from mild to very severe. The symptoms usually travel from the low back to the buttocks and down the back of the leg. Symptoms can include:  Mild tingling or dull aches in the lower back, leg, or hip.  Numbness in the back of the calf or sole of the foot.  Burning sensations in the lower back, leg, or hip.  Sharp pains in the lower back, leg, or hip.  Leg weakness.  Severe back pain inhibiting movement. These symptoms may get worse with coughing, sneezing, laughing, or prolonged sitting or standing. Also, being overweight may worsen symptoms. DIAGNOSIS  Your caregiver will perform a physical exam to look for common symptoms of sciatica. He or she may ask you to do certain movements or activities that would trigger sciatic nerve pain. Other tests may be performed to find the cause of the sciatica. These may include:  Blood tests.  X-rays.  Imaging tests, such as an MRI or CT scan. TREATMENT  Treatment is directed at the cause of the sciatic pain. Sometimes, treatment is not necessary  and the pain and discomfort goes away on its own. If treatment is needed, your caregiver may suggest:  Over-the-counter medicines to relieve pain.  Prescription medicines, such as anti-inflammatory medicine, muscle relaxants, or narcotics.  Applying heat or ice to the painful area.  Steroid injections to lessen pain, irritation, and inflammation around the nerve.  Reducing activity during periods of pain.  Exercising and stretching to strengthen your abdomen and improve flexibility of your spine. Your caregiver may suggest losing weight if the extra weight makes the back pain worse.  Physical therapy.  Surgery to eliminate what is pressing or pinching the nerve, such as a bone spur or part of a herniated disk. HOME CARE INSTRUCTIONS   Only take over-the-counter or prescription medicines for pain or discomfort as directed by your caregiver.  Apply ice to the affected area for 20 minutes, 3-4 times a day for the first 48-72 hours. Then try heat in the same way.  Exercise, stretch, or perform your usual activities if these do not aggravate your pain.  Attend physical therapy sessions as directed by your caregiver.  Keep all follow-up appointments as directed by your caregiver.  Do not wear high heels or shoes that do not provide proper support.  Check your mattress to see if it is too soft. A firm mattress may lessen your pain and discomfort. SEEK IMMEDIATE MEDICAL CARE IF:   You lose control of your bowel or bladder (incontinence).  You have increasing weakness in the lower back, pelvis, buttocks,   or legs.  You have redness or swelling of your back.  You have a burning sensation when you urinate.  You have pain that gets worse when you lie down or awakens you at night.  Your pain is worse than you have experienced in the past.  Your pain is lasting longer than 4 weeks.  You are suddenly losing weight without reason. MAKE SURE YOU:  Understand these  instructions.  Will watch your condition.  Will get help right away if you are not doing well or get worse. Document Released: 04/28/2001 Document Revised: 11/03/2011 Document Reviewed: 09/13/2011 ExitCare Patient Information 2015 [employer]. This information is not intended to replace advice given to you by your health care provider. Make sure you discuss any questions you have with your health care provider.  

## 2014-04-30 NOTE — ED Provider Notes (Signed)
CSN: [id number]     Arrival date & time 04/30/14  1138 History   First MD Initiated Contact with Patient 04/30/14 1211     Chief Complaint  Patient presents with  . Spasms    rigth leg     (Consider location/radiation/quality/duration/timing/severity/associated sxs/prior Treatment) HPI The patient is having spasmodic kind of pains down the back of his right leg. He indicates it starts right in the center of his buttock. He reports yesterday he had pretty intense spasms in his hamstring and calf area. He also reports today he felt some tingling in his foot. The patient did take a plane ride and also had been sitting for some time. He was concerned about the possibility of a blood clot. He has no associated shortness of breath, chest pain or lightheadedness. There is no bowel or bladder incontinence. The patient does report he has had some constipation although he did have some bowel movement today. He otherwise has a negative review of systems. The patient has been treated in the past by his family doctor for cervical spasm and pains and has naproxen and Flexeril from a prior prescription. He tried a dose yesterday but didn't notice much improvement. Past Medical History  Diagnosis Date  . Sleep apnea   . Gout   . Childhood asthma   . ADHD (attention deficit hyperactivity disorder)   . CSA (central sleep apnea) 08/25/2012  . OSA (obstructive sleep apnea) 08/25/2012  . ADD (attention deficit disorder) 08/25/2012  . Allergic rhinitis 08/25/2012   Past Surgical History  Procedure Laterality Date  . Wisdom tooth extraction     Family History  Problem Relation Age of Onset  . High Cholesterol Mother   . High Cholesterol Father   . Gout Father    History  Substance Use Topics  . Smoking status: Never Smoker   . Smokeless tobacco: Not on file  . Alcohol Use: Yes    Review of Systems 10 Systems reviewed and are negative for acute change except as noted in the HPI.    Allergies   Review of patient's allergies indicates no known allergies.  Home Medications   Prior to Admission medications   Medication Sig Start Date End Date Taking? Authorizing Provider  colchicine 0.6 MG tablet Take 0.6 mg by mouth as needed (gout).    Yes Historical Provider, MD  cyclobenzaprine (FLEXERIL) 10 MG tablet Take 10 mg by mouth 3 (three) times daily as needed for muscle spasms.   Yes Historical Provider, MD  ibuprofen (ADVIL,MOTRIN) 200 MG tablet Take 400 mg by mouth every 6 (six) hours as needed for moderate pain. pain   Yes Historical Provider, MD  Loratadine (CLARITIN) 10 MG CAPS Take 10 mg by mouth daily as needed (allergies).    Yes Historical Provider, MD  mometasone (NASONEX) 50 MCG/ACT nasal spray Place 2 sprays into the nose at bedtime.    Yes Historical Provider, MD  Multiple Vitamin (MULTIVITAMIN WITH MINERALS) TABS tablet Take 1 tablet by mouth every morning.   Yes Historical Provider, MD  naproxen sodium (ANAPROX) 550 MG tablet Take 550 mg by mouth 2 (two) times daily as needed for mild pain.  02/08/14  Yes Historical Provider, MD  VYVANSE 70 MG capsule Take 70 mg by mouth every morning. 03/19/14  Yes Historical Provider, MD   BP 120/82 mmHg  Pulse 100  Temp(Src) 97.9 F (36.6 C) (Oral)  Resp 18  SpO2 99% Physical Exam  Constitutional: He is oriented to person, place, and  time.  The patient is morbidly obese but well in appearance. No respiratory distress. Mental status is clear.  HENT:  Head: Normocephalic and atraumatic.  Eyes: EOM are normal.  Cardiovascular: Normal rate, regular rhythm, normal heart sounds and intact distal pulses.   Pulmonary/Chest: Effort normal and breath sounds normal. No respiratory distress. He has no wheezes. He has no rales.  Abdominal: Soft. Bowel sounds are normal. He exhibits no distension and no mass. There is no tenderness. There is no rebound and no guarding.  Musculoskeletal: He exhibits no edema.  The patient has reproducible pain  with right straight leg raise to 20. Sensation and motor testing of the lower extremities is normal. The patient's gait is normal. With palpation over the right buttock at the sciatic nerve exit patient endorses this as the source of his pain.  Neurological: He is alert and oriented to person, place, and time. Coordination normal.  Skin: Skin is warm and dry.  Psychiatric: He has a normal mood and affect.    ED Course  Procedures (including critical care time) Labs Review Labs Reviewed - No data to display  Imaging Review No results found.   EKG Interpretation None      MDM   Final diagnoses:  Sciatica, right   The patient's pain pattern and history are consistent with sciatica. He does not have any neurologic dysfunction. Patient was concerned for DVT however at this point that seems a very low probability relative to musculoskeletal related symptoms. The calf is soft and nontender. And the pain pattern is classic for sciatic nerve.    Arby BarretteMarcy Freeman Borba, MD 04/30/14 [phone]

## 2014-10-19 ENCOUNTER — Ambulatory Visit: Payer: BC Managed Care – PPO | Admitting: Neurology

## 2014-11-13 ENCOUNTER — Ambulatory Visit: Payer: Self-pay | Admitting: Neurology

## 2014-11-13 ENCOUNTER — Telehealth: Payer: Self-pay

## 2014-11-13 NOTE — Telephone Encounter (Signed)
Patient did not show to appt today  

## 2014-11-21 ENCOUNTER — Encounter: Payer: Self-pay | Admitting: Neurology

## 2019-08-04 ENCOUNTER — Ambulatory Visit: Payer: BLUE CROSS/BLUE SHIELD | Attending: Internal Medicine

## 2019-08-04 DIAGNOSIS — Z23 Encounter for immunization: Secondary | ICD-10-CM

## 2019-08-04 NOTE — Progress Notes (Signed)
   Covid-19 Vaccination Clinic  Name:  Christian Shaw    MRN: [id number] DOB: [birth year]  08/04/2019  [name] was observed post Covid-19 immunization for 15 minutes without incident. He was provided with Vaccine Information Sheet and instruction to access the V-Safe system.   [name] was instructed to call 911 with any severe reactions post vaccine: Marland Kitchen Difficulty breathing  . Swelling of face and throat  . A fast heartbeat  . A bad rash all over body  . Dizziness and weakness   Immunizations Administered    Name Date Dose VIS Date Route   Pfizer COVID-19 Vaccine 08/04/2019  8:24 AM 0.3 mL 04/28/2019 Intramuscular   Manufacturer: [employer], [employer]   Lot: [id number]   NDC: [address]-0

## 2019-08-29 ENCOUNTER — Ambulatory Visit: Payer: BLUE CROSS/BLUE SHIELD | Attending: Internal Medicine

## 2019-08-29 DIAGNOSIS — Z23 Encounter for immunization: Secondary | ICD-10-CM

## 2019-08-29 NOTE — Progress Notes (Signed)
   Covid-19 Vaccination Clinic  Name:  Danie Diehl    MRN: [id number] DOB: 1975/12/26  08/29/2019  Mr. Ide was observed post Covid-19 immunization for 15 minutes without incident. He was provided with Vaccine Information Sheet and instruction to access the V-Safe system.   Mr. Tagliaferro was instructed to call 911 with any severe reactions post vaccine: Marland Kitchen Difficulty breathing  . Swelling of face and throat  . A fast heartbeat  . A bad rash all over body  . Dizziness and weakness   Immunizations Administered    Name Date Dose VIS Date Route   Pfizer COVID-19 Vaccine 08/29/2019  8:48 AM 0.3 mL 04/28/2019 Intramuscular   Manufacturer: [employer], [employer]   Lot: [id number]   NDC: [address]-7

## 2020-12-18 ENCOUNTER — Encounter: Payer: Self-pay | Admitting: *Deleted

## 2020-12-19 ENCOUNTER — Ambulatory Visit: Payer: BC Managed Care – PPO | Admitting: Neurology

## 2020-12-19 ENCOUNTER — Encounter: Payer: Self-pay | Admitting: Neurology

## 2020-12-19 VITALS — BP 135/79 | HR 85 | Ht 71.0 in | Wt 370.2 lb

## 2020-12-19 DIAGNOSIS — Z6841 Body Mass Index (BMI) 40.0 and over, adult: Secondary | ICD-10-CM

## 2020-12-19 DIAGNOSIS — G4733 Obstructive sleep apnea (adult) (pediatric): Secondary | ICD-10-CM | POA: Diagnosis not present

## 2020-12-19 DIAGNOSIS — G4731 Primary central sleep apnea: Secondary | ICD-10-CM | POA: Diagnosis not present

## 2020-12-19 NOTE — Patient Instructions (Signed)
It was nice to see you again today.  You are fully compliant with your BiPAP ST machine.  Keep up the good work.  You are eligible for a new machine; as discussed, I will try to get you a new BiPAP ST machine based on your sleep study from 2014 as you have been compliant with treatment and numbers look good including your apnea scores.  You have also continued to benefit from treatment.  Until you get a new machine, please continue to be fully compliant with your current machine.  Once you get your new machine, you will have to be seen in follow-up within 61 to 89 days of starting treatment with your new machine even though the settings are the same.  This is typically an insurance requirement.

## 2020-12-19 NOTE — Progress Notes (Addendum)
CM sent to [facility] for new bipap machine. Received. 12-23-20 sy

## 2020-12-19 NOTE — Progress Notes (Signed)
Subjective:    Patient ID: Christian Shaw is a 45 y.o. male.  HPI    Star Age, MD, PhD [facility] [address], [address], [place] [address]  Dear Dr. Alyson Ingles,   I saw your patient, Christian Shaw, upon your kind request in the sleep clinic today for reevaluation of his sleep apnea.  The patient is unaccompanied today.  I had followed him for his complex sleep apnea several years ago.  As you know, Christian Shaw is a 45 year old right-handed gentleman with an underlying medical history of allergic rhinitis, corneal dystrophy, ADHD, gout, low back pain, hypogonadism, on hormone replacement, and morbid obesity with a BMI of over 50, who reports that his current BiPAP machine is old.  He has been using it consistently.  He has consistently benefited from treatment and generally does not go without it.  Sometimes it is difficult to use it when he has nasal congestion.  He takes Nasacort.  He has seen an error message on his machine telling him that motor life has been exceeded.  His machine is nearly 47 years old or thereabouts.  I reviewed your office note from 09/18/2020. He was previously diagnosed with complex sleep apnea.  Original diagnosis goes back to approximately 2008.  He also had sleep apnea surgery in the past.  He had a split-night sleep study through our sleep lab in April 2014 which indicated severe obstructive sleep apnea with an AHI of 85.6/h, O2 nadir 75%.  He had obstructive and central events.  He was placed on BiPAP therapy.  I last saw him in 2015, at which time he was compliant with his BiPAP of 17/12 centimeters with a backup rate of 14. He goes to bed around 1 AM and rise time is between 9 and 10 AM.  He lives with his family including wife and 35 year old son.  He works from home.  He designs lighting for buildings.  I was able to review his most recent compliance data from 11/20/2020 through 12/19/2020, total of 30 days, during  which time he used his machine every night with percent use days greater than 4 hours at 97%, indicating excellent compliance with an average usage of 6 hours and 51 minutes, pressure at 19/12 with a rate of 14, BiPAP ST.  Average AHI at goal at 1.6/h, leak on the lower side with a 95th percentile at 7 L/min.  He reports that he has been getting his supplies online.  Previous DME company was [employer].  He has been using a Mirage Quatro fullface mask, size medium.  He drinks caffeine in the form of soda, generally 1 in the morning.  He drinks alcohol once or twice a week.  He is a non-smoker.  He is no longer on Vyvanse or Flexeril.  Back pain has improved.  Previously:   I saw him on 04/23/2014, at which time he reported difficulty tolerating his BiPAP when he had a cold with significant nasal congestion at the time. Otherwise, he was doing well with BiPAP therapy and was compliant with treatment. I reviewed his BiPAP compliance data from 10/10/2014 through 11/08/2014 which is a total of 30 days during which time he used his machine every night with percent used days greater than 4 hours at 93%, indicating excellent compliance with an average usage of 6 hours and 31 minutes, residual AHI borderline at 4.9 per hour, leaked low with the 95th percentile at 3.2 L/m on a pressure of  17/12 cm with a rate of 14/m.  I saw him on 10/23/2013, at which time he reported allergy symptoms. Overall, he was doing well with BiPAP. He was somewhat Vyvanse 70 mg daily.  I reviewed his BiPAP compliance data from 03/20/2014 through 04/18/2014 which is a total of 30 days during which time he used his machine every night. Percent used days greater than 4 hours was 93%, indicating excellent compliance, average usage of 6 hours and 45 minutes, residual AHI good at 3.3 per hour, leak low for the 95th percentile at 5.4 L/m, pressure at 17/12 with a rate of 14.  I saw him on 05/01/2013, at which time I ordered a new BiPAP  machine for him because his machine was over 84 years old. He reported doing well at the time in order at the same settings. In the interim, he called back in February reporting that the pressure was too high. I reviewed the patient's BiPAP compliance data from 07/07/2013 to 08/05/2013, which is a total of 30 days, during which time the patient used BiPAP every day. The average usage for all days was 6 hours and 7 minutes. The percent used days greater than 4 hours was 97 %, indicating excellent compliance. The residual AHI was 5.2 per hour, indicating an adequate treatment pressure of 17/12 cwp with a respiratory rate of 14. Air leak from the mask was low.   I reviewed his compliance data from 09/22/2013 through 10/21/2013 which is a total of 30 days during which time he used his BiPAP every day, with percent used days greater than 4 hours at 97%, indicating excellent compliance. Residual AHI was 4.2 per hour and leak was very low with the 95th percentile at 3.2 lpm.   I saw him on 01/27/2013 at which time I talked to him about his split-night sleep study. He has a prior diagnosis of complex sleep apnea and was diagnosed at [facility]. I suggested that we increase his BiPAP setting from 16/11 to 17/12 cm. I suggested that we try this and if needed bring him back for a BiPAP re-titration or change him from BiPAP ST to ASV.   I reviewed his compliance data from 02/28/2013 through 04/30/2013 (62 days), during which time he used the machine every night. He had an average usage for all days of 5 hours and 48 minutes. His percent used days greater than 4 hours was 87.1%. He is residual AHI is not given on this download. He has been on BiPAP ST 17/12 cm with RR of 14.   I first met him on 08/25/2012 at which time he reported a history of complex sleep apnea, which was diagnosed by a sleep specialist at [facility] years ago and he was given a BiPAP machine. His BiPAP ST was high at 19/11 cm with a backup rate of 11. His backup rate  was reduced to 8. He has a history of sleep apnea surgery in February 2009 in the form of rhinoplasty.   His first sleep study was in 08/2006 showed an AHI of 108.55/h. He had a total of 27 central apneas and 6 central hypopneas. His lowest oxygen saturation was 73%. He was diagnosed with severe complex sleep apnea. He was then brought back a month later for CPAP titration and was titrated to a pressure of 15 cm with oxygen at 2 L per minute. He had trouble tolerating CPAP and eventually had a BiPAP titration. Originally, he was placed on BiPAP of 19/11 cm with a  backup rate of 11 and then changed to a backup rate of 8. He has always been compliant with treatment and I have reviewed old or compliance data as well.   I suggested that he come back for a full night BiPAP titration study and he had a split-night study on 08/30/2012 which I reviewed with him last time. Sleep efficiency was reduced severely at 32.6% with a latency to sleep of only 7.5 minutes but wake after sleep onset of 117.5 minutes with moderate sleep fragmentation noted. He had a markedly elevated arousal index of 94.4 per hour and a markedly increased percentage of stage I sleep at 81.1% and very low percentage of stage II sleep and absence of slow-wave and REM sleep. He had a total of 17 obstructive and 33 central apneas as well as 27 obstructive and 2 central hypopneas rendering and elevated AHI of 85.6 per hour. His baseline oxygen saturation was 92% with a nadir of 75%. He was then titrated on BiPAP starting at 14/10 cm and titrating up to 16/12 cm. He had an AHI of 9.1 on that pressure. REM sleep was achieved but no supine REM sleep. His post PAP oxygen saturation was 93% on average with a nadir of 87%.   I reviewed his compliance data from 08/29/2012 through 01/26/2013 (151 days), during which time he used it every day. His percent used days greater than 4 hours was 88.7%. Average usage for all days was 5 h, 37 minutes. His compliance data  from 10/17/2012 through 01/26/2013 showed daily usage as well with an average usage of 5 hours and 36 minutes and percent used days greater than 4 hours of 89.2% indicating very good compliance. He had intermittent severe nasal congestion, for which he takes prn Claritin, nasonex nightly. He has had some stressors written regards to his wife's health in the past. This improved.    His Past Medical History Is Significant For: Past Medical History:  Diagnosis Date   ADD (attention deficit disorder) 08/25/2012   ADHD (attention deficit hyperactivity disorder)    Allergic rhinitis 08/25/2012   Childhood asthma    Corneal dystrophy    CSA (central sleep apnea) 08/25/2012   Gout    Low back pain    Morbid obesity (HCC)    OSA (obstructive sleep apnea) 08/25/2012   Sleep apnea     His Past Surgical History Is Significant For: Past Surgical History:  Procedure Laterality Date   TONGUE BASE REDUCTION SOMNOPLASTY  2008   WISDOM TOOTH EXTRACTION      His Family History Is Significant For: Family History  Problem Relation Age of Onset   High Cholesterol Mother    High Cholesterol Father    Gout Father    Heart Problems Maternal Grandmother    CAD Maternal Grandmother    CVA Maternal Grandfather    Hypertension Paternal Grandmother    Heart attack Paternal Grandfather    CAD Paternal Grandfather     His Social History Is Significant For: Social History   Socioeconomic History   Marital status: Married    Spouse name: Not on file   Number of children: Not on file   Years of education: Not on file   Highest education level: Not on file  Occupational History   Not on file  Tobacco Use   Smoking status: Never   Smokeless tobacco: Never  Vaping Use   Vaping Use: Never used  Substance and Sexual Activity   Alcohol use: Yes  Comment: 2 a week   Drug use: No   Sexual activity: Not on file  Other Topics Concern   Not on file  Social History Narrative   Caffeine one can soda  daily.  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    Social Determinants of Health   Financial Resource Strain: Not on file  Food Insecurity: Not on file  Transportation Needs: Not on file  Physical Activity: Not on file  Stress: Not on file  Social Connections: Not on file    His Allergies Are:  Allergies  Allergen Reactions   Fluticasone     Sneezing, coughing, trouble breathing  :   His Current Medications Are:  Outpatient Encounter Medications as of 12/19/2020  Medication Sig   acetaminophen (TYLENOL) 500 MG tablet Take 500 mg by mouth every 6 (six) hours as needed.   allopurinol (ZYLOPRIM) 300 MG tablet Take 300 mg by mouth daily.   colchicine 0.6 MG tablet Take by mouth as needed (gout). Take 2 tablets by mouth at onset of gout attack, then 1-2 tabs daily thereafter as needed.   ibuprofen (ADVIL) 200 MG tablet Take 200 mg by mouth every 6 (six) hours as needed.   loratadine (CLARITIN) 10 MG tablet Take 10 mg by mouth daily as needed for allergies.   Multiple Vitamin (MULTIVITAMIN WITH MINERALS) TABS tablet Take 1 tablet by mouth every morning.   naproxen sodium (ALEVE) 220 MG tablet 2.5 tablet orally every 12 hours with food as needed for pain   testosterone (ANDROGEL) 50 MG/5GM (1%) GEL apply contents of 1 packet transdermal once daily to the posterior shoulders   Triamcinolone Acetonide (NASACORT AQ NA) Place into the nose.   [DISCONTINUED] VYVANSE 70 MG capsule Take 70 mg by mouth every morning.   No facility-administered encounter medications on file as of 12/19/2020.  :   Review of Systems:  Out of a complete 14 point review of systems, all are reviewed and negative with the exception of these symptoms as listed below:   Review of Systems  Neurological:        OSA, needs new cpap machine.  [employer]-[facility].  Epworth Sleepiness Scale 0= would never doze 1= slight chance of dozing 2= moderate chance of dozing 3= high chance of  dozing  Sitting and reading:1 Watching TV:1 Sitting inactive in a public place (ex. Theater or meeting):0 As a passenger in a car for an hour without a break:0 Lying down to rest in the afternoon:1 Sitting and talking to someone:0 Sitting quietly after lunch (no alcohol):0 In a car, while stopped in traffic:0 Total: 3 FSS: 26   Objective:  Neurological Exam  Physical Exam Physical Examination:   Vitals:   12/19/20 0826  BP: 135/79  Pulse: 85   General Examination: The patient is a very pleasant 45y.o. male in no acute distress. He appears well-developed and well-nourished and well groomed.   HEENT: Normocephalic, atraumatic, pupils are equal, round and reactive to light, extraocular tracking is well-preserved, corrective eyeglasses in place.  Face is symmetric with normal facial animation.  Neck is supple, no carotid bruits, airway examination reveals mild to moderate mouth dryness, tongue protrudes centrally and palate elevates symmetrically, moderate airway crowding noted, Mallampati class III.    Chest: is clear to auscultation without wheezing, rhonchi or crackles noted.   Heart: sounds are regular and normal without murmurs, rubs or gallops noted.   Abdomen: is soft, non-tender and non-distended.   Extremities: There  is no pitting edema in the distal lower extremities bilaterally.   Skin: is warm and dry with no trophic changes noted.   Musculoskeletal: exam reveals no obvious joint deformities.   Neurologically: Mental status: The patient is awake, alert and oriented in all 4 spheres. His memory, attention, language and knowledge are appropriate. There is no aphasia, agnosia, apraxia or anomia. Speech is clear with normal prosody and enunciation. Thought process is linear. Mood is congruent and affect is normal. Cranial nerves are as described above under HEENT exam. Motor exam: Normal bulk, strength and tone is noted. There is no tremor. Fine motor skills are  grossly intact.  Cerebellar exam: There is no dysmetria or intention tremor. There is no truncal or gait ataxia. Sensory exam is intact to light touch in the upper and lower extremities.   Gait, station and balance are unremarkable. No veering to one side is noted. No leaning to one side. Posture is age-appropriate and stance is narrow based. No problems turning are noted.   Assessment and Plan:    In summary, Christian Shaw is a 45 year old right-handed gentleman with an underlying medical history of allergic rhinitis, corneal dystrophy, ADHD, gout, low back pain, hypogonadism, on hormone replacement, and morbid obesity with a BMI of over 50, who presents for reevaluation of his severe sleep disordered breathing with evidence of primary central sleep apnea, severe complex sleep apnea, status post apnea surgery in the distant past, and treatment with BiPAP ST.  He had his last sleep study in April 2014 which confirmed severe sleep disordered breathing.  He continues to be compliant with his BiPAP ST, currently at 19/12 centimeters with a backup rate of 14.  He has previously shown compliance with his visits and with his usage.  He is commended for his treatment adherence.  He continues to benefit from treatment and is due for a new machine.  He has seen an error message on his current machine reporting that motor life has been exceeded which is understandable.  This machine should be at least 47 years old.  He is advised that I will write for new BiPAP ST machine and we will request replacement from his DME company, [employer].  He is advised to continue using his current machine until he can be set up with a new set of equipment.  He will need a follow-up within 60 to 90 days of starting treatment with his new machine.  He is advised to continue to work on weight loss.  If insurance requires repeat testing, we can consider a home sleep test if that is possible.  He would be agreeable.  We will await  response from his DME company first.  I answered all his questions today and he was in agreement with the plan.  Thank you very much for allowing me to participate in the care of this nice patient. If I can be of any further assistance to you please do not hesitate to call me at [phone].  Sincerely,   Star Age, MD, PhD

## 2021-01-06 ENCOUNTER — Encounter: Payer: Self-pay | Admitting: Neurology

## 2021-03-03 ENCOUNTER — Telehealth: Payer: Self-pay | Admitting: Neurology

## 2021-03-03 DIAGNOSIS — G4731 Primary central sleep apnea: Secondary | ICD-10-CM

## 2021-03-03 DIAGNOSIS — G4733 Obstructive sleep apnea (adult) (pediatric): Secondary | ICD-10-CM

## 2021-03-03 NOTE — Telephone Encounter (Signed)
Pt need Initial BIPAP appt to remain compliantwith insurance between 02/21/21 and 04/22/21. Would like a call from the nurse to discuss an sooner appt.

## 2021-03-03 NOTE — Telephone Encounter (Signed)
Secure message sent to [facility] w/ [employer]. Will update when I hear back.

## 2021-03-03 NOTE — Telephone Encounter (Signed)
Spoke with patient.  He states he received the wrong machine and now he has a Development worker, community.  A BiPAP ST was ordered however the patient is stating he was told by [employer] that he did not qualify even though he has been on a BiPAP ST for many years.  We scheduled him for appointment on Tuesday, December 5 at 745 AM.  He states he was set up on his loaner machine on 01/22/2021.  He asked if there was anything else he needed to do at this time.  I let him know I would reach out to [employer] to see if he was going to be able to get a BiPAP ST machine.

## 2021-03-06 ENCOUNTER — Ambulatory Visit: Payer: BC Managed Care – PPO | Admitting: Adult Health

## 2021-03-10 ENCOUNTER — Ambulatory Visit: Payer: BC Managed Care – PPO | Admitting: Neurology

## 2021-03-12 NOTE — Addendum Note (Signed)
Addended by: Huston Foley on: 03/12/2021 10:38 AM   Modules accepted: Orders

## 2021-03-12 NOTE — Telephone Encounter (Signed)
My chart message sent to pt.

## 2021-03-12 NOTE — Telephone Encounter (Signed)
We may have to proceed with a home sleep test first.  Please check with his DME provider whether he needs a diagnostic study showing central respiratory events of over 50%, or do we go directly to a titration study which may or may not show enough central events.

## 2021-03-12 NOTE — Telephone Encounter (Signed)
Ok will ask now and report back.

## 2021-03-12 NOTE — Telephone Encounter (Signed)
It makes sense to start with a home sleep test.  I have placed an order for this and copied Meagan. Please let patient know as well.

## 2021-03-24 ENCOUNTER — Telehealth: Payer: Self-pay | Admitting: Neurology

## 2021-03-24 NOTE — Telephone Encounter (Signed)
Pt. Was left a voice mail today to schedule his HST and was advised to call back to schedule.

## 2021-03-31 ENCOUNTER — Telehealth: Payer: Self-pay | Admitting: Neurology

## 2021-03-31 NOTE — Telephone Encounter (Signed)
LVM for pt to call me back to schedule sleep study  

## 2021-04-07 ENCOUNTER — Telehealth: Payer: Self-pay

## 2021-04-07 NOTE — Telephone Encounter (Signed)
We have attempted to call the patient 2 times to schedule sleep study. Patient has been unavailable at the phone numbers we have on file and has not returned our calls. If patient calls back we will schedule them for their sleep study. ° °

## 2021-04-17 NOTE — Telephone Encounter (Signed)
I spoke with Morrie Sheldon at [employer].  She states that BiPAP ST machine was taken back from the patient.  He is likely using his old machine a ResMed S9 with a setting of 19/12 with a backup rate of 14.  She stated his prior sleep study from April 2014 did not qualify patient for a BiPAP ST machine.  Unfortunately patient never have gotten a BiPAP ST machine and he must fail BiPAP before he can qualify for ST.  Her recommendation is to start from scratch and order another sleep study like a split-night in-lab or a BiPAP titration.  She believes he would need to also have centrals in order to qualify.

## 2021-04-17 NOTE — Telephone Encounter (Signed)
Spoke with patient.  He is amenable to proceeding with a sleep study in order to try to obtain a BiPAP ST. He understands a sleep test has to confirm that he fail other treatments such as CPAP/BIPAP before he qualifies.

## 2021-04-17 NOTE — Telephone Encounter (Signed)
Spoke with Morrie Sheldon at [employer], can we order a split night study instead?  12/5 appt canceled. Pt aware.

## 2021-04-17 NOTE — Addendum Note (Signed)
Addended by: Huston Foley on: 04/17/2021 05:32 PM   Modules accepted: Orders

## 2021-04-17 NOTE — Telephone Encounter (Signed)
Split night sleep study ordered.  

## 2021-04-17 NOTE — Telephone Encounter (Signed)
We can cancel upcoming appt. Please advise patient that we will need new sleep studies, starting with a split night sleep study or at least a home sleep test (HST order is on file).

## 2021-04-21 ENCOUNTER — Ambulatory Visit: Payer: BC Managed Care – PPO | Admitting: Neurology

## 2021-04-29 ENCOUNTER — Telehealth: Payer: Self-pay | Admitting: Neurology

## 2021-04-29 NOTE — Telephone Encounter (Signed)
LVM for pt to call me back to schedule sleep study  

## 2021-05-06 ENCOUNTER — Telehealth: Payer: Self-pay | Admitting: Neurology

## 2021-05-06 NOTE — Telephone Encounter (Signed)
We have attempted to call the patient 2 times to schedule sleep study. Patient has been unavailable at the phone numbers we have on file and has not returned our calls. If patient calls back we will schedule them for their sleep study. ° °

## 2021-06-03 ENCOUNTER — Encounter: Payer: Self-pay | Admitting: Neurology

## 2021-06-04 NOTE — Telephone Encounter (Signed)
Returned pt's call and LVM for pt to call me back to schedule sleep study ° °

## 2021-11-25 ENCOUNTER — Encounter: Payer: Self-pay | Admitting: Internal Medicine

## 2021-11-25 ENCOUNTER — Ambulatory Visit (INDEPENDENT_AMBULATORY_CARE_PROVIDER_SITE_OTHER): Payer: BC Managed Care – PPO | Admitting: Internal Medicine

## 2021-11-25 VITALS — BP 138/80 | HR 99 | Ht 70.0 in | Wt 369.0 lb

## 2021-11-25 DIAGNOSIS — R0602 Shortness of breath: Secondary | ICD-10-CM

## 2021-11-25 MED ORDER — ALBUTEROL SULFATE HFA 108 (90 BASE) MCG/ACT IN AERS: 2.0000 | INHALATION_SPRAY | Freq: Four times a day (QID) | RESPIRATORY_TRACT | 3 refills | Status: DC | PRN

## 2021-11-25 MED ORDER — AZELASTINE HCL 0.1 % NA SOLN: 1.0000 | Freq: Two times a day (BID) | NASAL | 5 refills | Status: DC

## 2021-11-25 NOTE — Progress Notes (Signed)
Christian Shaw    [id number]    01-10-1976  Primary Care Physician:Reade, Molly Maduro, MD  Referring Physician: Elias Else, MD [phone] [address] [place],  [place] [address] Reason for Consultation: shortness of breath Date of Consultation: 11/25/2021  Chief complaint:   Chief Complaint  Patient presents with   Consult    Pt consult post-covid symptoms like chest tightness, chest congestion, SOB on exertion, no coughing/wheezing.      HPI: Christian Shaw is a 46 y.o. man who presents for new patient evaluation for chest tightness and shortness of breath. Has history of childhood asthma. Had covid in fall 2022. Since then symptoms have been going on with exertion but also notices at rest. Denies wheezing. No coughing. Symptoms do not wake him up at night. When he goes for a walk around the block he has to work harder than he did previously.   Childhood asthma symptoms were exercise induced, cold weather related. Had a rescue inhaler. He played football and soccer as a kid but did have to take his inhaler to keep up with peers.    Has had allergy testing with shots in the past. Sensitive to dustmites, ragweed, pine trees. Takes allegra daily. Just started this back up and this is helping. He does have worsening allergies right now. He has been on nasacort in the past which did help.   He had nasal turbinade reduction.   Also has OSA on Bpap - well controlled. Download reviewed. 100% adherence with minimal leak, excellent AHI suppression. Settings are 19 over 12. Managed by Dr. Almyra Brace at [facility]  Social history:  Occupation: Contractor, lighting for buildings, office based job.  Exposures:  at home with wife and son. Has a dog.  Smoking history: never smoker, no passive smoke exposure  Social History   Occupational History   Not on file  Tobacco Use   Smoking status: Never   Smokeless tobacco: Never  Vaping Use   Vaping Use: Never used  Substance and Sexual  Activity   Alcohol use: Yes    Comment: 2 a week   Drug use: No   Sexual activity: Not on file    Relevant family history:  Family History  Problem Relation Age of Onset   High Cholesterol Mother    High Cholesterol Father    Gout Father    Heart Problems Maternal Grandmother    CAD Maternal Grandmother    CVA Maternal Grandfather    Hypertension Paternal Grandmother    Heart attack Paternal Grandfather    CAD Paternal Grandfather    Lung disease Neg Hx     Past Medical History:  Diagnosis Date   ADD (attention deficit disorder) 08/25/2012   ADHD (attention deficit hyperactivity disorder)    Allergic rhinitis 08/25/2012   Childhood asthma    Corneal dystrophy    CSA (central sleep apnea) 08/25/2012   Gout    Low back pain    Morbid obesity (HCC)    OSA (obstructive sleep apnea) 08/25/2012   Sleep apnea     Past Surgical History:  Procedure Laterality Date   TONGUE BASE REDUCTION SOMNOPLASTY  2008   WISDOM TOOTH EXTRACTION       Physical Exam: Blood pressure 138/80, pulse 99, height 5\' 10"  (1.778 m), weight (!) 369 lb (167.4 kg), SpO2 96 %. Gen:      No acute distress ENT:  mild nasal debris, no nasal polyps, mucus membranes moist Lungs:  No increased respiratory effort, symmetric chest wall excursion, clear to auscultation bilaterally, no wheezes or crackles CV:         Regular rate and rhythm; no murmurs, rubs, or gallops.  No pedal edema Abd:      + bowel sounds; soft, non-tender; no distension MSK: no acute synovitis of DIP or PIP joints, no mechanics hands.  Skin:      Warm and dry; no rashes Neuro: normal speech, no focal facial asymmetry Psych: alert and oriented x3, normal mood and affect   Data Reviewed/Medical Decision Making:  Independent interpretation of tests: Imaging:   PFTs:   Labs:  Lab Results  Component Value Date   WBC 6.7 07/01/2011   HGB 15.7 07/01/2011   HCT 45.6 07/01/2011   MCV 80.3 07/01/2011   PLT 187 07/01/2011    Lab Results  Component Value Date   NA 136 07/01/2011   K 3.5 07/01/2011   CL 99 07/01/2011   CO2 26 07/01/2011     Immunization status:  Immunization History  Administered Date(s) Administered   PFIZER(Purple Top)SARS-COV-2 Vaccination 08/04/2019, 08/29/2019     I reviewed prior external note(s) from PCP  I reviewed the result(s) of the labs and imaging as noted above.   I have ordered PFT   Assessment:  Shortness of breath, new  History of asthma Allergic rhinitis, not well controlled  Plan/Recommendations: Suspect reactive airways disease based on symptoms. Will trial albuterol prn Obtain pfts Add astelin nasal spray to allegra for rhinitis.   We discussed disease management and progression at length today.     Return to Care: Return in about 1 month (around 12/26/2021).  Durel Salts, MD Pulmonary and Critical Care Medicine [facility] Office:[phone]  CC: Elias Else, MD

## 2021-11-25 NOTE — Progress Notes (Signed)
The patient has been prescribed the inhaler albuterol. Inhaler technique was demonstrated to patient. The patient subsequently demonstrated correct technique.  

## 2021-11-25 NOTE — Patient Instructions (Addendum)
Please schedule follow up scheduled with myself in 1 months.  If my schedule is not open yet, we will contact you with a reminder closer to that time. Please call [phone] if you haven't heard from [place] a month before.   Before your next visit I would like you to have:  Full set of PFTs, follow up with me after  Continue allegra Take astelin - 1 spray on each side of your nose twice a day for first week, then 1 spray on each side.   Instructions for use: If you also use a saline nasal spray or rinse, use that first. Position the head with the chin slightly tucked. Use the right hand to spray into the left nostril and the right hand to spray into the left nostril.   Point the bottle away from the septum of your nose (cartilage that divides the two sides of your nose).  Hold the nostril closed on the opposite side from where you will spray Spray once and gently sniff to pull the medicine into the higher parts of your nose.  Don't sniff too hard as the medicine will drain down the back of your throat instead. Repeat with a second spray on the same side if prescribed. Repeat on the other side of your nose.  Take the albuterol rescue inhaler every 4 to 6 hours as needed for wheezing or shortness of breath. You can also take it 15 minutes before exercise or exertional activity. Side effects include heart racing or pounding, jitters or anxiety. If you have a history of an irregular heart rhythm, it can make this worse. Can also give some patients a hard time sleeping.  To inhale the aerosol using an inhaler, follow these steps:  Remove the protective dust cap from the end of the mouthpiece. If the dust cap was not placed on the mouthpiece, check the mouthpiece for dirt or other objects. Be sure that the canister is fully and firmly inserted in the mouthpiece. 2. If you are using the inhaler for the first time or if you have not used the inhaler in more than 14 days, you will need to prime it. You  may also need to prime the inhaler if it has been dropped. Ask your pharmacist or check the manufacturer's information if this happens. To prime the inhaler, shake it well and then press down on the canister 4 times to release 4 sprays into the air, away from your face. Be careful not to get albuterol in your eyes. 3. Shake the inhaler well. 4. Breathe out as completely as possible through your mouth. 4. Hold the canister with the mouthpiece on the bottom, facing you and the canister pointing upward. Place the open end of the mouthpiece into your mouth. Close your lips tightly around the mouthpiece. 6. Breathe in slowly and deeply through the mouthpiece.At the same time, press down once on the container to spray the medication into your mouth. 7. Try to hold your breath for 10 seconds. remove the inhaler, and breathe out slowly. 8. If you were told to use 2 puffs, wait 1 minute and then repeat steps 3-7. 9. Replace the protective cap on the inhaler. 10. Clean your inhaler regularly. Follow the manufacturer's directions carefully and ask your doctor or pharmacist if you have any questions about cleaning your inhaler.  Check the back of the inhaler to keep track of the total number of doses left on the inhaler.

## 2022-01-02 ENCOUNTER — Ambulatory Visit (INDEPENDENT_AMBULATORY_CARE_PROVIDER_SITE_OTHER): Payer: BC Managed Care – PPO | Admitting: Internal Medicine

## 2022-01-02 ENCOUNTER — Encounter: Payer: Self-pay | Admitting: Internal Medicine

## 2022-01-02 VITALS — BP 110/70 | HR 97 | Ht 70.0 in | Wt 374.0 lb

## 2022-01-02 DIAGNOSIS — R0602 Shortness of breath: Secondary | ICD-10-CM | POA: Diagnosis not present

## 2022-01-02 DIAGNOSIS — J310 Chronic rhinitis: Secondary | ICD-10-CM

## 2022-01-02 LAB — PULMONARY FUNCTION TEST
DL/VA % pred: 125 %
DL/VA: 5.6 ml/min/mmHg/L
DLCO cor % pred: 127 %
DLCO cor: 37.9 ml/min/mmHg
DLCO unc % pred: 127 %
DLCO unc: 37.9 ml/min/mmHg
FEF 25-75 Post: 3.21 L/sec
FEF 25-75 Pre: 3.06 L/sec
FEF2575-%Change-Post: 4 %
FEF2575-%Pred-Post: 89 %
FEF2575-%Pred-Pre: 85 %
FEV1-%Change-Post: 0 %
FEV1-%Pred-Post: 93 %
FEV1-%Pred-Pre: 92 %
FEV1-Post: 3.74 L
FEV1-Pre: 3.71 L
FEV1FVC-%Change-Post: 2 %
FEV1FVC-%Pred-Pre: 97 %
FEV6-%Change-Post: -1 %
FEV6-%Pred-Post: 96 %
FEV6-%Pred-Pre: 98 %
FEV6-Post: 4.79 L
FEV6-Pre: 4.88 L
FEV6FVC-%Change-Post: 0 %
FEV6FVC-%Pred-Post: 103 %
FEV6FVC-%Pred-Pre: 103 %
FVC-%Change-Post: -1 %
FVC-%Pred-Post: 93 %
FVC-%Pred-Pre: 95 %
FVC-Post: 4.79 L
FVC-Pre: 4.88 L
Post FEV1/FVC ratio: 78 %
Post FEV6/FVC ratio: 100 %
Pre FEV1/FVC ratio: 76 %
Pre FEV6/FVC Ratio: 100 %
RV % pred: 116 %
RV: 2.33 L
TLC % pred: 104 %
TLC: 7.28 L

## 2022-01-02 NOTE — Progress Notes (Signed)
Christian Shaw    [id number]    1976/04/03  Primary Care Physician:Reade, Molly Maduro, MD Date of Appointment: 01/02/2022 Established Patient Visit  Chief complaint:   Chief Complaint  Patient presents with   Follow-up    Follow-up: PFT     HPI: Christian Shaw is a 46 y.o. man with history of OSA on BPAP and recent covid infection here with shortness of breath.   Interval Updates: Here for follow up after PFTs and a trial of prn albuterol.  PFTs show normal pulmonary function.    I have reviewed the patient's family social and past medical history and updated as appropriate.   Past Medical History:  Diagnosis Date   ADD (attention deficit disorder) 08/25/2012   ADHD (attention deficit hyperactivity disorder)    Allergic rhinitis 08/25/2012   Childhood asthma    Corneal dystrophy    CSA (central sleep apnea) 08/25/2012   Gout    Low back pain    Morbid obesity (HCC)    OSA (obstructive sleep apnea) 08/25/2012   Sleep apnea     Past Surgical History:  Procedure Laterality Date   TONGUE BASE REDUCTION SOMNOPLASTY  2008   WISDOM TOOTH EXTRACTION      Family History  Problem Relation Age of Onset   High Cholesterol Mother    High Cholesterol Father    Gout Father    Heart Problems Maternal Grandmother    CAD Maternal Grandmother    CVA Maternal Grandfather    Hypertension Paternal Grandmother    Heart attack Paternal Grandfather    CAD Paternal Grandfather    Lung disease Neg Hx     Social History   Occupational History   Not on file  Tobacco Use   Smoking status: Never   Smokeless tobacco: Never  Vaping Use   Vaping Use: Never used  Substance and Sexual Activity   Alcohol use: Yes    Comment: 2 a week   Drug use: No   Sexual activity: Not on file     Physical Exam: Blood pressure 110/70, pulse 97, height 5\' 10"  (1.778 m), weight (!) 374 lb (169.6 kg), SpO2 93 %.  Gen:      No acute distress ENT:  no nasal polyps, mucus  membranes moist Lungs:    No increased respiratory effort, symmetric chest wall excursion, clear to auscultation bilaterally, no wheezes or crackles CV:         Regular rate and rhythm; no murmurs, rubs, or gallops.  No pedal edema   Data Reviewed: Imaging:   PFTs:     Latest Ref Rng & Units 01/02/2022    8:58 AM  PFT Results  FVC-Pre L 4.88  P  FVC-Predicted Pre % 95  P  FVC-Post L 4.79  P  FVC-Predicted Post % 93  P  Pre FEV1/FVC % % 76  P  Post FEV1/FCV % % 78  P  FEV1-Pre L 3.71  P  FEV1-Predicted Pre % 92  P  FEV1-Post L 3.74  P  DLCO uncorrected ml/min/mmHg 37.90  P  DLCO UNC% % 127  P  DLCO corrected ml/min/mmHg 37.90  P  DLCO COR %Predicted % 127  P  DLVA Predicted % 125  P  TLC L 7.28  P  TLC % Predicted % 104  P  RV % Predicted % 116  P    P Preliminary result   I have personally reviewed the patient's PFTs and normal  pulmonary function.   Labs:  Immunization status: Immunization History  Administered Date(s) Administered   Influenza,inj,Quad PF,6-35 Mos 03/07/2018   PFIZER(Purple Top)SARS-COV-2 Vaccination 08/04/2019, 08/29/2019   Pneumococcal Polysaccharide-23 11/22/1996   Tdap 06/26/2013     Assessment:  Shortness of breath, likely deconditioning from recent infection in the setting of known OSA.  Chronic rhinitis  Plan/Recommendations: Continue allegra and astelin for rhinitis since they are helping.  Recommend initiating a regular exercise routine to help with improving stamina and endurance. Walking is great for your lungs!   Return to Care: Return in about 1 year (around 01/03/2023).   Durel Salts, MD Pulmonary and Critical Care Medicine [facility] Office:[phone]

## 2022-01-02 NOTE — Patient Instructions (Signed)
Please schedule follow up scheduled with myself in 1 year.  If my schedule is not open yet, we will contact you with a reminder closer to that time. Please call [phone] if you haven't heard from [place] a month before.   Continue the astelin and allegra. Let me know if those aren't controlling your nasal congestion.  Breathing testing was normal! Continue a gentle exercise routine and increase activity slightly day by day, week by week. Hopefully we can get you back to feeling the way ou were before you got sick.   Call with questions or if symptoms worsen.

## 2022-01-02 NOTE — Progress Notes (Signed)
Full PFT Performed Today  

## 2022-01-02 NOTE — Patient Instructions (Signed)
Full PFT Performed Today  

## 2022-08-26 ENCOUNTER — Other Ambulatory Visit: Payer: Self-pay | Admitting: Internal Medicine

## 2022-09-29 ENCOUNTER — Encounter (HOSPITAL_COMMUNITY): Payer: Self-pay | Admitting: Gastroenterology

## 2022-09-29 NOTE — Progress Notes (Signed)
Attempted to obtain medical history via telephone, unable to reach at this time. HIPAA compliant voicemail message left requesting return call to pre surgical testing department. 

## 2022-10-05 NOTE — H&P (Signed)
History of Present Illness General:         Patient is a 47 year old male, new patient, who presents to set up screening colonoscopy.  Medical history of OSA, morbid obesity.        Labs 10/20/2021 - Normal CBC, CMP, PSA        Patient states he has had some fecal seepage in the last 3 months. Will have a bowel movement and later finds he is not completely clean.        Denies diarrhea, constipation, melena, hematochezia, rectal pain or discomfort. Reports history of hemorrhoids.        Denies abdominal pain, nausea, vomiting, fever, chills, dysphagia, reflux/heartburn.        States his mother has had colon polyps in recent years, otherwise denies family history of GI malignancy or disease.        Reports he has central and obstructive sleep apnea, is on a BiPAP at night. Denies oxygen use.        Denies MI/stroke history, is not on blood thinners, is not diabetic.  Current Medications Taking Aleve(Naproxen Sodium) 220 MG Tablet 2 1/2 tablet Orally every 12 hrs with food, as needed for pain , Notes to Pharmacist: rare Allopurinol 300 MG Tablet 1 tablet by mouth Once a day Colcrys 0.6 MG Tablet 2 tablets by mouth at onset of gout attack, then 1 -2 tabs daily thereafter as needed , Notes to Pharmacist: as needed Multivitamin . Tablet 1 tablet by mouth Once daily Nasacort Allergy 24HR(Triamcinolone Acetonide) 55 MCG/ACT Aerosol 1 spray in each nostril Nasally Once a day Testosterone 50 MG/5GM (1%) Gel 1 packet applied to posterior shoulders Transdermal Once a day Triamcinolone Acetonide 0.1 % Cream 1 application sparingly to affected area Externally Twice a day Tylenol Extra Strength(Acetaminophen) 500 MG Tablet 1 tablet as needed Orally every 6 hrs , Notes to Pharmacist: rare Azelastine HCl 137 MCG/SPRAY Solution 1 puff in each nostril Nasally Once a day Medication List reviewed and reconciled with the patient Past Medical History      Neg urology w/u for microscopic hematuria--approx 1998.       Gout.      sleep apnea, rx BIPAP by specialist at [facility].      Allergic rhinitis.      Corneal dystrophy.      Morbid obesity.      ADHD, predominantly inattentive type.      Obstructive sleep apnea.      Gout.      Allergic rhinitis.      Corneal dystrophy.      Low back pain.      BMI 50.0-59.9, adult.      Bethany Blue - Dentist.      Janet Berlin - Ophthalmologist/corneal dystrophy. Surgical History       somnoplasty 2008       Wisdom Teeth Family History Father: alive 10 yrs, hypercholesterolemia Mother: alive 42 yrs, colon polyp Paternal Grand Father: deceased 69 yrs, heart attack @ 36, diagnosed with Coronary artery disease Paternal Grand Mother: deceased 52 yrs, diagnosed with Hypertension Maternal Grand Father: deceased 2 yrs, heart and lung disease, diagnosed with CVA in 70 Maternal Grand Mother: deceased 20 yrs, heart attack @ 22, diagnosed with Coronary artery disease Sister 1: alive 62 yrs Son(s): alive 14 yrs 1 sister(s) . 1 son(s) . No Family History of Colon Cancer, Liver Disease. Mom had polyps. Social History General:  Tobacco use      cigarettes:  Never smoked     Tobacco history last updated  03/06/2022 EXPOSURE TO PASSIVE SMOKE: no. Alcohol: yes, 3 per week, beer, wine. Recreational drug use: no, no. Exercise: 3-4 days per week, primarily walking. Marital Status: Married. Children: 1 boy. Seat belt use: always. Allergies Flonase: sneezing, coughing, trouble breathing - Allergy Hospitalization/Major Diagnostic Procedure see surgery None in the past year 02/2022 Review of Systems GI PROCEDURE:         Pacemaker/ AICD no.  Artificial heart valves no.  MI/heart attack no.  Abnormal heart rhythm no.  Angina no.  CVA no.  Hypertension no.  Hypotension no.  Asthma, COPD YES         .  Sleep apnea YES, BIPAP, Central and Obstructive         .  Seizure disorders no.  Artificial joints no.  Diabetes no.  Significant headaches no.   Vertigo YES         .  Depression/anxiety YES         .  Abnormal bleeding no.  Kidney Disease no.  Liver disease no.  Blood transfusion no.  Vital Signs Wt: 372.4, Wt change: 5.6 lbs, Ht: 69.75, BMI: 53.81, Temp: 97.9, Pulse sitting: 89, BP sitting: 112/77. Examination Gastroenterology Exam:        GENERAL APPEARANCE: Pleasant, morbidly obese, no acute distress.        RESPIRATORY Breath sounds normal. Respiration even and unlabored.        CARDIOVASCULAR RRR w/o murmurs or gallops. No peripheral edema.        ABDOMEN Soft, obese abdomen, nontender. No masses palpated. Liver and spleen not palpated, normal. Bowel sounds normal, Abdomen not distended.        EXTREMITIES: No edema, pulses intact.         SKIN Warm and dry, good turgor without rashes.         PSYCHIATRIC Alert and oriented x3, mood and affect appear normal..        NEURO: alert, normal strength and tone.   Assessments 1. Screen for colon cancer - Z12.11 (Primary)2. Fecal smearing - Treatment 1. Screen for colon cancer       IMAGING: Colonoscopy             procedure did not get scheduled in office due to being booked in the hospital. Pt added to waitlist and action created. Clinical Notes: Patient is due for screening colonoscopy. I thoroughly discussed the procedure with the patient including but not limited to nature, alternatives, benefits, and risks (including but not limited to bleeding, infection, perforation, anesthesia/cardiopulmonary complications). All questions were answered and the patient acknowledges these risks and wishes to proceed with colonoscopy.Procedure will need to be done at the hospital due based on BMI >50.  2. Fecal smearing Clinical Notes: Recommend fiber supplement, further evaluation with upcoming colonoscopy.

## 2022-10-06 ENCOUNTER — Ambulatory Visit (HOSPITAL_COMMUNITY)
Admission: RE | Admit: 2022-10-06 | Discharge: 2022-10-06 | Disposition: A | Discharge status: Home / Self Care | Payer: BC Managed Care – PPO | Attending: Gastroenterology | Admitting: Gastroenterology

## 2022-10-06 ENCOUNTER — Encounter (HOSPITAL_COMMUNITY)
Admission: RE | Disposition: A | Discharge status: Home / Self Care | Payer: Self-pay | Source: Home / Self Care | Attending: Gastroenterology

## 2022-10-06 ENCOUNTER — Encounter (HOSPITAL_COMMUNITY): Payer: Self-pay | Admitting: Gastroenterology

## 2022-10-06 ENCOUNTER — Ambulatory Visit (HOSPITAL_COMMUNITY): Payer: BC Managed Care – PPO | Admitting: Anesthesiology

## 2022-10-06 ENCOUNTER — Other Ambulatory Visit: Payer: Self-pay

## 2022-10-06 DIAGNOSIS — G4733 Obstructive sleep apnea (adult) (pediatric): Secondary | ICD-10-CM | POA: Diagnosis not present

## 2022-10-06 DIAGNOSIS — K648 Other hemorrhoids: Secondary | ICD-10-CM | POA: Diagnosis not present

## 2022-10-06 DIAGNOSIS — Z1211 Encounter for screening for malignant neoplasm of colon: Secondary | ICD-10-CM | POA: Diagnosis present

## 2022-10-06 DIAGNOSIS — D123 Benign neoplasm of transverse colon: Secondary | ICD-10-CM | POA: Insufficient documentation

## 2022-10-06 DIAGNOSIS — D128 Benign neoplasm of rectum: Secondary | ICD-10-CM | POA: Diagnosis not present

## 2022-10-06 DIAGNOSIS — Z83719 Family history of colon polyps, unspecified: Secondary | ICD-10-CM | POA: Insufficient documentation

## 2022-10-06 DIAGNOSIS — K5730 Diverticulosis of large intestine without perforation or abscess without bleeding: Secondary | ICD-10-CM | POA: Insufficient documentation

## 2022-10-06 DIAGNOSIS — Z6841 Body Mass Index (BMI) 40.0 and over, adult: Secondary | ICD-10-CM | POA: Diagnosis not present

## 2022-10-06 DIAGNOSIS — D124 Benign neoplasm of descending colon: Secondary | ICD-10-CM | POA: Insufficient documentation

## 2022-10-06 HISTORY — PX: POLYPECTOMY: SHX5525

## 2022-10-06 HISTORY — PX: COLONOSCOPY WITH PROPOFOL: SHX5780

## 2022-10-06 SURGERY — COLONOSCOPY WITH PROPOFOL
Anesthesia: Monitor Anesthesia Care

## 2022-10-06 MED ORDER — PROPOFOL 500 MG/50ML IV EMUL
INTRAVENOUS | Status: DC | PRN
  Administered 2022-10-06: 150 ug/kg/min via INTRAVENOUS

## 2022-10-06 MED ORDER — SODIUM CHLORIDE 0.9 % IV SOLN: INTRAVENOUS | Status: DC

## 2022-10-06 MED ORDER — PROPOFOL 10 MG/ML IV BOLUS
INTRAVENOUS | Status: DC | PRN
  Administered 2022-10-06 (×2): 50 mg via INTRAVENOUS

## 2022-10-06 MED ORDER — LACTATED RINGERS IV SOLN: INTRAVENOUS | Status: DC

## 2022-10-06 SURGICAL SUPPLY — 22 items

## 2022-10-06 NOTE — Op Note (Signed)
[facility] Patient Name: Christian Shaw Procedure Date: 10/06/2022 MRN: [id number] Attending MD: Kerin Salen , MD, [id number] Date of Birth: [birth year] CSN: [id number] Age: 47 Admit Type: Outpatient Procedure:                Colonoscopy Indications:              Screening for colorectal malignant neoplasm, This                            is the patient's first colonoscopy Providers:                Kerin Salen, MD, Carlena Hurl, Rozetta Nunnery, Technician Referring MD:             Elias Else, MD Medicines:                See the Anesthesia note for documentation of the                            administered medications Complications:            No immediate complications. Estimated blood loss:                            Minimal. Estimated Blood Loss:     Estimated blood loss was minimal. Procedure:                Pre-Anesthesia Assessment:                           - Prior to the procedure, a History and Physical                            was performed, and patient medications and                            allergies were reviewed. The patient's tolerance of                            previous anesthesia was also reviewed. The risks                            and benefits of the procedure and the sedation                            options and risks were discussed with the patient.                            All questions were answered, and informed consent                            was obtained. Prior Anticoagulants: The patient has                            taken no  anticoagulant or antiplatelet agents. ASA                            Grade Assessment: III - A patient with severe                            systemic disease. After reviewing the risks and                            benefits, the patient was deemed in satisfactory                            condition to undergo the procedure.                           After  obtaining informed consent, the colonoscope                            was passed under direct vision. Throughout the                            procedure, the patient's blood pressure, pulse, and                            oxygen saturations were monitored continuously. The                            CF-HQ190L ([id number]) Olympus colonoscope was                            introduced through the anus and advanced to the the                            terminal ileum. The colonoscopy was performed                            without difficulty. The patient tolerated the                            procedure well. The quality of the bowel                            preparation was good. The terminal ileum, ileocecal                            valve, appendiceal orifice, and rectum were                            photographed. Scope In: 9:00:48 AM Scope Out: 9:15:43 AM Scope Withdrawal Time: 0 hours 10 minutes 52 seconds  Total Procedure Duration: 0 hours 14 minutes 55 seconds  Findings:      The perianal and digital rectal examinations were normal.      The terminal ileum appeared normal.      A 5 mm polyp was found  in the rectum. The polyp was sessile. The polyp       was removed with a piecemeal technique using a cold biopsy forceps.       Resection and retrieval were complete.      A 4 mm polyp was found in the hepatic flexure. The polyp was sessile.       The polyp was removed with a piecemeal technique using a cold biopsy       forceps. Resection and retrieval were complete.      A 3 mm polyp was found in the descending colon. The polyp was sessile.       The polyp was removed with a piecemeal technique using a cold biopsy       forceps. Resection and retrieval were complete.      A few medium-mouthed and small-mouthed diverticula were found in the       sigmoid colon and descending colon.      Non-bleeding internal hemorrhoids were found during retroflexion. Impression:               -  The examined portion of the ileum was normal.                           - One 5 mm polyp in the rectum, removed piecemeal                            using a cold biopsy forceps. Resected and retrieved.                           - One 4 mm polyp at the hepatic flexure, removed                            piecemeal using a cold biopsy forceps. Resected and                            retrieved.                           - One 3 mm polyp in the descending colon, removed                            piecemeal using a cold biopsy forceps. Resected and                            retrieved.                           - Diverticulosis in the sigmoid colon and in the                            descending colon.                           - Non-bleeding internal hemorrhoids. Moderate Sedation:      Patient did not receive moderate sedation for this procedure, but       instead received monitored anesthesia care. Recommendation:           -  Patient has a contact number available for                            emergencies. The signs and symptoms of potential                            delayed complications were discussed with the                            patient. Return to normal activities tomorrow.                            Written discharge instructions were provided to the                            patient.                           - High fiber diet.                           - Continue present medications.                           - Await pathology results.                           - Repeat colonoscopy for surveillance based on                            pathology results. Procedure Code(s):        --- Professional ---                           [phone], Colonoscopy, flexible; with biopsy, single                            or multiple Diagnosis Code(s):        --- Professional ---                           Z12.11, Encounter for screening for malignant                            neoplasm of  colon                           K64.8, Other hemorrhoids                           D12.8, Benign neoplasm of rectum                           D12.3, Benign neoplasm of transverse colon (hepatic                            flexure or splenic flexure)  D12.4, Benign neoplasm of descending colon                           K57.30, Diverticulosis of large intestine without                            perforation or abscess without bleeding CPT copyright 2022 [employer]. All rights reserved. The codes documented in this report are preliminary and upon coder review may  be revised to meet current compliance requirements. Kerin Salen, MD 10/06/2022 9:24:44 AM This report has been signed electronically. Number of Addenda: 0

## 2022-10-06 NOTE — Discharge Instructions (Signed)

## 2022-10-06 NOTE — Anesthesia Postprocedure Evaluation (Signed)
Anesthesia Post Note  Patient: Christian Shaw  Procedure(s) Performed: COLONOSCOPY WITH PROPOFOL POLYPECTOMY     Patient location during evaluation: PACU Anesthesia Type: MAC Level of consciousness: awake and alert Pain management: pain level controlled Vital Signs Assessment: post-procedure vital signs reviewed and stable Respiratory status: spontaneous breathing, nonlabored ventilation, respiratory function stable and patient connected to nasal cannula oxygen Cardiovascular status: stable and blood pressure returned to baseline Postop Assessment: no apparent nausea or vomiting Anesthetic complications: no   No notable events documented.  Last Vitals:  Vitals:   10/06/22 0940 10/06/22 0945  BP: 123/78   Pulse: 92 85  Resp: 16 15  Temp:    SpO2: 96% 95%    Last Pain:  Vitals:   10/06/22 0945  TempSrc:   PainSc: 0-No pain                 Collene Schlichter

## 2022-10-06 NOTE — Interval H&P Note (Signed)
History and Physical Interval Note: 47/male with morbid obesity for screening colonoscopy with propofol.  10/06/2022 8:54 AM  Christian Shaw  has presented today for colonoscopy, with the diagnosis of Screening for colon cancer.  The various methods of treatment have been discussed with the patient and family. After consideration of risks, benefits and other options for treatment, the patient has consented to  Procedure(s): COLONOSCOPY WITH PROPOFOL (N/A) as a surgical intervention.  The patient's history has been reviewed, patient examined, no change in status, stable for surgery.  I have reviewed the patient's chart and labs.  Questions were answered to the patient's satisfaction.     Kerin Salen

## 2022-10-06 NOTE — Transfer of Care (Signed)
Immediate Anesthesia Transfer of Care Note  Patient: Christian Shaw  Procedure(s) Performed: COLONOSCOPY WITH PROPOFOL POLYPECTOMY  Patient Location: PACU  Anesthesia Type:MAC  Level of Consciousness: awake, alert , oriented, and patient cooperative  Airway & Oxygen Therapy: Patient Spontanous Breathing and Patient connected to face mask oxygen  Post-op Assessment: Report given to RN, Post -op Vital signs reviewed and stable, and Patient moving all extremities X 4  Post vital signs: Reviewed and stable  Last Vitals:  Vitals Value Taken Time  BP 125/47 10/06/22 0920  Temp 36.7 C 10/06/22 0920  Pulse 100 10/06/22 0923  Resp 14 10/06/22 0923  SpO2 96 % 10/06/22 0923  Vitals shown include unvalidated device data.  Last Pain:  Vitals:   10/06/22 0920  TempSrc: Temporal  PainSc: 0-No pain         Complications: No notable events documented.

## 2022-10-06 NOTE — Anesthesia Preprocedure Evaluation (Signed)
Anesthesia Evaluation  Patient identified by MRN, date of birth, ID band Patient awake    Reviewed: Allergy & Precautions, NPO status , Patient's Chart, lab work & pertinent test results  Airway Mallampati: II  TM Distance: >3 FB Neck ROM: Full    Dental  (+) Teeth Intact, Dental Advisory Given   Pulmonary asthma , sleep apnea    Pulmonary exam normal breath sounds clear to auscultation       Cardiovascular negative cardio ROS Normal cardiovascular exam Rhythm:Regular Rate:Normal     Neuro/Psych negative neurological ROS     GI/Hepatic negative GI ROS, Neg liver ROS,,,  Endo/Other    Morbid obesity (BMI 53)  Renal/GU negative Renal ROS     Musculoskeletal negative musculoskeletal ROS (+)    Abdominal   Peds  (+) ATTENTION DEFICIT DISORDER WITHOUT HYPERACTIVITY and ADHD Hematology negative hematology ROS (+)   Anesthesia Other Findings Day of surgery medications reviewed with the patient.  Reproductive/Obstetrics                             Anesthesia Physical Anesthesia Plan  ASA: 4  Anesthesia Plan: MAC   Post-op Pain Management: Minimal or no pain anticipated   Induction: Intravenous  PONV Risk Score and Plan: 1 and TIVA and Treatment may vary due to age or medical condition  Airway Management Planned: Simple Face Mask and Natural Airway  Additional Equipment:   Intra-op Plan:   Post-operative Plan:   Informed Consent: I have reviewed the patients History and Physical, chart, labs and discussed the procedure including the risks, benefits and alternatives for the proposed anesthesia with the patient or authorized representative who has indicated his/her understanding and acceptance.     Dental advisory given  Plan Discussed with: CRNA  Anesthesia Plan Comments:        Anesthesia Quick Evaluation

## 2022-10-08 LAB — SURGICAL PATHOLOGY

## 2022-10-11 ENCOUNTER — Encounter (HOSPITAL_COMMUNITY): Payer: Self-pay | Admitting: Gastroenterology

## 2023-01-20 ENCOUNTER — Ambulatory Visit: Payer: BC Managed Care – PPO | Admitting: Internal Medicine

## 2023-01-20 ENCOUNTER — Encounter: Payer: Self-pay | Admitting: Internal Medicine

## 2023-01-20 VITALS — BP 130/74 | HR 80 | Ht 70.0 in | Wt 358.8 lb

## 2023-01-20 DIAGNOSIS — R0602 Shortness of breath: Secondary | ICD-10-CM

## 2023-01-20 DIAGNOSIS — J301 Allergic rhinitis due to pollen: Secondary | ICD-10-CM

## 2023-01-20 MED ORDER — AZELASTINE HCL 0.1 % NA SOLN: 1.0000 | Freq: Two times a day (BID) | NASAL | 11 refills | Status: AC | PRN

## 2023-01-20 NOTE — Progress Notes (Signed)
Christian Shaw    [id number]    [birth year]  Primary Care Physician:Becker, Ann Maki, PA Date of Appointment: 01/20/2023 Established Patient Visit  Chief complaint:   Chief Complaint  Patient presents with   Follow-up    Pt states he has been doing well. No concerns      HPI: Christian Shaw is a 47 y.o. man with history of OSA on BPAP (follows with neurology) and chronic rhinitis  Interval Updates: Here for annual follow up.  Using astelin and allegra for rhinitis.   His breathing actually got better on its own gradually. He was able to exercise when he went to [place] and hiked in the mountains. Trying to get some regular walking and biking in.   OSA doing ok - seeing Dr. Frances Furbish.   I have reviewed the patient's family social and past medical history and updated as appropriate.   Past Medical History:  Diagnosis Date   ADD (attention deficit disorder) 08/25/2012   ADHD (attention deficit hyperactivity disorder)    Allergic rhinitis 08/25/2012   Childhood asthma    Corneal dystrophy    CSA (central sleep apnea) 08/25/2012   Gout    Low back pain    Morbid obesity (HCC)    OSA (obstructive sleep apnea) 08/25/2012   Sleep apnea     Past Surgical History:  Procedure Laterality Date   COLONOSCOPY WITH PROPOFOL N/A 10/06/2022   Procedure: COLONOSCOPY WITH PROPOFOL;  Surgeon: Kerin Salen, MD;  Location: [facility];  Service: Gastroenterology;  Laterality: N/A;   POLYPECTOMY  10/06/2022   Procedure: POLYPECTOMY;  Surgeon: Kerin Salen, MD;  Location: [facility];  Service: Gastroenterology;;   TONGUE BASE REDUCTION SOMNOPLASTY  2008   WISDOM TOOTH EXTRACTION      Family History  Problem Relation Age of Onset   High Cholesterol Mother    High Cholesterol Father    Gout Father    Heart Problems Maternal Grandmother    CAD Maternal Grandmother    CVA Maternal Grandfather    Hypertension Paternal Grandmother    Heart attack Paternal Grandfather     CAD Paternal Grandfather    Lung disease Neg Hx     Social History   Occupational History   Not on file  Tobacco Use   Smoking status: Never   Smokeless tobacco: Never  Vaping Use   Vaping status: Never Used  Substance and Sexual Activity   Alcohol use: Yes    Comment: 2 a week   Drug use: No   Sexual activity: Not on file     Physical Exam: Blood pressure 130/74, pulse 80, height 5\' 10"  (1.778 m), weight (!) 358 lb 12.8 oz (162.8 kg), SpO2 98%.  Gen:      No distress, obese Lungs:    ctab no wheezes or crackles CV:         RRR no mrg, no pedal edema   Data Reviewed: Imaging:   PFTs:     Latest Ref Rng & Units 01/02/2022    8:58 AM  PFT Results  FVC-Pre L 4.88   FVC-Predicted Pre % 95   FVC-Post L 4.79   FVC-Predicted Post % 93   Pre FEV1/FVC % % 76   Post FEV1/FCV % % 78   FEV1-Pre L 3.71   FEV1-Predicted Pre % 92   FEV1-Post L 3.74   DLCO uncorrected ml/min/mmHg 37.90   DLCO UNC% % 127   DLCO corrected ml/min/mmHg 37.90  DLCO COR %Predicted % 127   DLVA Predicted % 125   TLC L 7.28   TLC % Predicted % 104   RV % Predicted % 116    I have personally reviewed the patient's PFTs and normal pulmonary function.   Labs:  Immunization status: Immunization History  Administered Date(s) Administered   Influenza,inj,Quad PF,6-35 Mos 03/07/2018   PFIZER(Purple Top)SARS-COV-2 Vaccination 08/04/2019, 08/29/2019   Pneumococcal Polysaccharide-23 11/22/1996   Tdap 06/26/2013     Assessment:  Shortness of breath - improved  Chronic rhinitis - controlled  Plan/Recommendations:  Continue astelin nasal spray and allegra for allergies.   Glad your breathing is better!   We gave you the flu shot today.    Return to Care: No follow-ups on file.   Durel Salts, MD Pulmonary and Critical Care Medicine [facility] Office:[phone]

## 2023-01-20 NOTE — Patient Instructions (Signed)
It was a pleasure to see you today!  Please schedule follow up as needed.   If my schedule is not open yet, we will contact you with a reminder closer to that time. Please call [phone] if you haven't heard from [place] a month before, and always call us sooner if issues or concerns arise. You can also send [place] a message through MyChart, but but aware that this is not to be used for urgent issues and it may take up to 5-7 days to receive a reply.   Continue astelin nasal spray and allegra for allergies.   Glad your breathing is better!   We gave you the flu shot today.
